# Patient Record
Sex: Female | Born: 1980 | Race: White | Hispanic: No | Marital: Married | Smoking: Never smoker
Health system: Southern US, Community
[De-identification: ages and names within clinical notes are randomized; demographics above are authoritative.]

---

## 2009-08-18 ENCOUNTER — Ambulatory Visit: Payer: Self-pay | Admitting: Family Medicine

## 2009-08-19 ENCOUNTER — Encounter: Payer: Self-pay | Admitting: Family Medicine

## 2009-08-19 LAB — CONVERTED CEMR LAB
Eosinophils Relative: 4 % (ref 0–5)
HCT: 40.2 % (ref 36.0–46.0)
Lymphocytes Relative: 35 % (ref 12–46)
Lymphs Abs: 2 10*3/uL (ref 0.7–4.0)
Neutro Abs: 3.3 10*3/uL (ref 1.7–7.7)
Neutrophils Relative %: 56 % (ref 43–77)
Platelets: 252 10*3/uL (ref 150–400)
Sed Rate: 2 mm/hr (ref 0–22)
WBC: 5.8 10*3/uL (ref 4.0–10.5)

## 2010-07-26 ENCOUNTER — Ambulatory Visit: Payer: Self-pay | Admitting: Emergency Medicine

## 2010-07-26 LAB — CONVERTED CEMR LAB
Beta hcg, urine, semiquantitative: NEGATIVE
Nitrite: NEGATIVE
Protein, U semiquant: 30
Urobilinogen, UA: 0.2
pH: 5

## 2010-08-20 ENCOUNTER — Ambulatory Visit: Payer: Self-pay | Admitting: Family Medicine

## 2010-09-11 ENCOUNTER — Ambulatory Visit: Payer: Self-pay | Admitting: Family Medicine

## 2010-09-12 ENCOUNTER — Encounter: Payer: Self-pay | Admitting: Family Medicine

## 2010-09-14 ENCOUNTER — Telehealth (INDEPENDENT_AMBULATORY_CARE_PROVIDER_SITE_OTHER): Payer: Self-pay

## 2011-01-15 NOTE — Letter (Signed)
Summary: CONTROLLED MED PRESCRIPTION POLICY  CONTROLLED MED PRESCRIPTION POLICY   Imported By: Dannette Barbara 09/11/2010 11:36:31  _____________________________________________________________________  External Attachment:    Type:   Image     Comment:   External Document

## 2011-01-15 NOTE — Assessment & Plan Note (Signed)
Summary: Frequent, painful urination x 3 dys rm 4   Vital Signs:  Patient Profile:   30 Years Old Female CC:      Frequent, painful urination x 3 dys LMP:     06/17/2010 Height:     65 inches Weight:      133 pounds O2 Sat:      100 % O2 treatment:    Room Air Temp:     97.4 degrees F oral Pulse rate:   97 / minute Pulse rhythm:   regular Resp:     16 per minute BP sitting:   106 / 73  (left arm) Cuff size:   regular  Vitals Entered By: Areta Haber CMA (July 26, 2010 7:32 PM)  Menstrual History: LMP (date): 06/17/2010 LMP - Reliable: approximate (month known)                  Current Allergies: No known allergies History of Present Illness History from: patient Chief Complaint: Frequent, painful urination x 3 dys History of Present Illness: Dysuria, frequency, mild hematuria.  No F/C/N/V.  No back pain.  Very mild lower abd pain.  She has had a UTI before and it feels like this.  No OTC meds tried.  No vaginal discharge.  REVIEW OF SYSTEMS Constitutional Symptoms      Denies fever, chills, night sweats, weight loss, weight gain, and fatigue.  Eyes       Denies change in vision, eye pain, eye discharge, glasses, contact lenses, and eye surgery. Ear/Nose/Throat/Mouth       Denies hearing loss/aids, change in hearing, ear pain, ear discharge, dizziness, frequent runny nose, frequent nose bleeds, sinus problems, sore throat, hoarseness, and tooth pain or bleeding.  Respiratory       Denies dry cough, productive cough, wheezing, shortness of breath, asthma, bronchitis, and emphysema/COPD.  Cardiovascular       Denies murmurs, chest pain, and tires easily with exhertion.    Gastrointestinal       Denies stomach pain, nausea/vomiting, diarrhea, constipation, blood in bowel movements, and indigestion. Genitourniary       Complains of painful urination.      Denies kidney stones and loss of urinary control.      Comments: Frequent x 4 dys Neurological       Denies  paralysis, seizures, and fainting/blackouts. Musculoskeletal       Denies muscle pain, joint pain, joint stiffness, decreased range of motion, redness, swelling, muscle weakness, and gout.  Skin       Denies bruising, unusual mles/lumps or sores, and hair/skin or nail changes.  Psych       Denies mood changes, temper/anger issues, anxiety/stress, speech problems, depression, and sleep problems. Other Comments: Pt has not seen her PCP for this.  Physical Exam General appearance: well developed, well nourished, no acute distress Chest/Lungs: no rales, wheezes, or rhonchi bilateral, breath sounds equal without effort Heart: regular rate and  rhythm, no murmur Abdomen: soft, non-tender without obvious organomegaly Back: no CVAT Skin: no obvious rashes or lesions Assessment New Problems: URINARY TRACT INFECTION (ICD-599.0)   Patient Education: Patient and/or caregiver instructed in the following: rest, fluids, Ibuprofen prn.  Plan New Medications/Changes: BACTRIM DS 800-160 MG TABS (SULFAMETHOXAZOLE-TRIMETHOPRIM) 1 tab by mouth two times a day for 5 days  #10 x 0, 07/26/2010, Hoyt Koch MD PYRIDIUM 200 MG TABS (PHENAZOPYRIDINE HCL) 1 tab by mouth three times a day for 2 days for urinary pain  #6 x 0, 07/26/2010, Tinnie Gens  Henderson MD  New Orders: UA Dipstick w/o Micro (automated)  [81003] Urine Pregnancy Test  [81025] Est. Patient Level III [60454] Planning Comments:   Hydration Wipe front to back Follow-up with your primary care physician if not improving   The patient and/or caregiver has been counseled thoroughly with regard to medications prescribed including dosage, schedule, interactions, rationale for use, and possible side effects and they verbalize understanding.  Diagnoses and expected course of recovery discussed and will return if not improved as expected or if the condition worsens. Patient and/or caregiver verbalized understanding.  Prescriptions: BACTRIM DS  800-160 MG TABS (SULFAMETHOXAZOLE-TRIMETHOPRIM) 1 tab by mouth two times a day for 5 days  #10 x 0   Entered and Authorized by:   Hoyt Koch MD   Signed by:   Hoyt Koch MD on 07/26/2010   Method used:   Print then Give to Patient   RxID:   [id number] PYRIDIUM 200 MG TABS (PHENAZOPYRIDINE HCL) 1 tab by mouth three times a day for 2 days for urinary pain  #6 x 0   Entered and Authorized by:   Hoyt Koch MD   Signed by:   Hoyt Koch MD on 07/26/2010   Method used:   Print then Give to Patient   RxID:   [id number]   Orders Added: 1)  UA Dipstick w/o Micro (automated)  [81003] 2)  Urine Pregnancy Test  [81025] 3)  Est. Patient Level III [41324]  Laboratory Results   Urine Tests  Date/Time Received: July 26, 2010 7:40 PM  Date/Time Reported: July 26, 2010 7:40 PM   Routine Urinalysis   Color: brown Appearance: Hazy Glucose: negative   (Normal Range: Negative) Bilirubin: negative   (Normal Range: Negative) Ketone: negative   (Normal Range: Negative) Spec. Gravity: >=1.030   (Normal Range: 1.003-1.035) Blood: moderate   (Normal Range: Negative) pH: 5.0   (Normal Range: 5.0-8.0) Protein: 30   (Normal Range: Negative) Urobilinogen: 0.2   (Normal Range: 0-1) Nitrite: negative   (Normal Range: Negative) Leukocyte Esterace: small   (Normal Range: Negative)    Urine HCG: negative

## 2011-01-15 NOTE — Letter (Signed)
Summary: Out of Work  [facility]  [address]   [place], [place] [address]   Phone: [phone]  Fax: [phone]    September 11, 2010   Employee:  Victoria Pace    To Whom It May Concern:   For Medical reasons, please excuse the above named employee from work today.  If you need additional information, please feel free to contact our office.         Sincerely,    Donna Christen MD

## 2011-01-15 NOTE — Assessment & Plan Note (Signed)
Summary: Bee Sting - R hand x 2dys rm 5   Vital Signs:  Patient Profile:   30 Years Old Female CC:      Bee Sting - R hand x 2 dys Height:     65 inches Weight:      133 pounds O2 Sat:      100 % O2 treatment:    Room Air Temp:     97.5 degrees F oral Pulse rate:   61 / minute Pulse rhythm:   regular Resp:     16 per minute BP sitting:   77 / 59  (left arm) Cuff size:   regular  Vitals Entered By: Areta Haber CMA (August 20, 2010 8:28 AM)                  Current Allergies: No known allergies History of Present Illness Chief Complaint: Bee Sting - R hand x 2 dys History of Present Illness:  Subjective:  Patient complains of bee sting to the dorsum of her right hand two days ago.  Despite taking Benadryl, the redness and swelling have expanded, and the area has become more tender.  She feels well otherwise.  No fevers, chills, and sweats.  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  Eyes       Denies change in vision, eye pain, eye discharge, glasses, contact lenses, and eye surgery. Ear/Nose/Throat/Mouth       Denies hearing loss/aids, change in hearing, ear pain, ear discharge, dizziness, frequent runny nose, frequent nose bleeds, sinus problems, sore throat, hoarseness, and tooth pain or bleeding.  Respiratory       Denies dry cough, productive cough, wheezing, shortness of breath, asthma, bronchitis, and emphysema/COPD.  Cardiovascular       Denies murmurs, chest pain, and tires easily with exhertion.    Gastrointestinal       Denies stomach pain, nausea/vomiting, diarrhea, constipation, blood in  bowel movements, and indigestion. Genitourniary       Denies painful urination, kidney stones, and loss of urinary control. Neurological       Denies paralysis, seizures, and fainting/blackouts. Musculoskeletal       Complains of redness and swelling.      Denies muscle pain, joint pain, joint stiffness, decreased range of motion, muscle weakness, and gout.      Comments: R hand x 2 dys Skin       Denies bruising, unusual mles/lumps or sores, and hair/skin or nail changes.  Psych       Denies mood changes, temper/anger issues, anxiety/stress, speech problems, depression, and sleep problems. Other Comments: Pt states she got stung by a bee on Saturday 08/19/10 on her R hand (index finger).   Past History:  Past Medical History: Last updated: 08/18/2009 Unremarkable  Past Surgical History: Last updated: 08/18/2009 Denies surgical history  Family History: Last updated: 08/18/2009 unremarkable   Objective:  Appearance:  Patient appears healthy, stated age, and in no acute distress  Right hand:  Mild swelling, erythema, warmth, and tenderness over the second MCP joint.  All fingers have full range of motion.  Distal neurovascular intact  Assessment New Problems: TOXIC EFFECT OF  VENOM (ICD-989.5) CELLULITIS, HAND, RIGHT (ICD-682.4)  CELLULITIS AS A RESULT OF INSECT STING  Plan New Medications/Changes: CEPHALEXIN 500 MG TABS (CEPHALEXIN) One by mouth Q6HR  #28 x 0, 08/20/2010, Donna Christen MD  New Orders: Est. Patient Level III [phone] Planning Comments:   Begin Keflex for one week. Call if not improved 48 hours.  Return for worsening symptoms   The patient and/or caregiver has been counseled thoroughly with regard to medications prescribed including dosage, schedule, interactions, rationale for use, and possible side effects and they verbalize understanding.  Diagnoses and expected course of recovery discussed and will return if not improved as expected or if the condition  worsens. Patient and/or caregiver verbalized understanding.  Prescriptions: CEPHALEXIN 500 MG TABS (CEPHALEXIN) One by mouth Q6HR  #28 x 0   Entered and Authorized by:   Donna Christen MD   Signed by:   Donna Christen MD on 08/20/2010   Method used:   Print then Give to Patient   RxID:   [id number]   Orders Added: 1)  Est. Patient Level III [13244]

## 2011-01-15 NOTE — Assessment & Plan Note (Signed)
Summary: FLU SYMPTOMS   Vital Signs:  Patient Profile:   30 Years Old Female CC:      abdominal pain, nausea, diarrhea x 4 days Height:     65 inches Weight:      130 pounds O2 Sat:      100 % O2 treatment:    Room Air Temp:     97.6 degrees F oral Pulse rate:   101 / minute Resp:     14 per minute BP sitting:   83 / 64  (left arm) Cuff size:   regular  Pt. in pain?   yes    Location:   abdomen  Vitals Entered By: Lajean Saver RN (September 11, 2010 11:37 AM)                   Updated Prior Medication List: IMODIUM A-D 2 MG TABS (LOPERAMIDE HCL) as needed since diarrhea  Current Allergies: No known allergies History of Present Illness Chief Complaint: abdominal pain, nausea, diarrhea x 4 days History of Present Illness:  Subjective:  Patient complains of 4 day history of abdominal cramps, headache, fatigue, nausea without vomiting, myalgias, and watery diarrhea.  She has had chills but no fever.   Her diarrhea improves for about 12 hours with Imodium but recurs.  No recent foreign travel.  No untreated water.  No abdominal pain.  No urinary symptoms.  No blood instool  REVIEW OF SYSTEMS Constitutional Symptoms       Complains of chills and night sweats.     Denies fever, weight loss, weight gain, and fatigue.  Eyes       Denies change in vision, eye pain, eye discharge, glasses, contact lenses, and eye surgery. Ear/Nose/Throat/Mouth       Denies hearing loss/aids, change in hearing, ear pain, ear discharge, dizziness, frequent runny nose, frequent nose bleeds, sinus problems, sore throat, hoarseness, and tooth pain or bleeding.  Respiratory       Denies dry cough, productive cough, wheezing, shortness of breath, asthma, bronchitis, and emphysema/COPD.  Cardiovascular       Denies murmurs, chest pain, and tires easily with exhertion.    Gastrointestinal       Complains of stomach pain, nausea/vomiting, and diarrhea.      Denies constipation, blood in bowel  movements, and indigestion.      Comments: No vomiting Genitourniary       Denies painful urination, kidney stones, and loss of urinary control. Neurological       Denies paralysis, seizures, and fainting/blackouts. Musculoskeletal       Complains of muscle pain.      Denies joint pain, joint stiffness, decreased range of motion, redness, swelling, muscle weakness, and gout.      Comments: back and body aches Skin       Denies bruising, unusual mles/lumps or sores, and hair/skin or nail changes.  Psych       Denies mood changes, temper/anger issues, anxiety/stress, speech problems, depression, and sleep problems. Other Comments: Patient c/o diarrhea begining about 4 days ago foloowed by abdominal pain, body aches, and lower backpain. Denies any vomiting or fever. Unabl to eat or drink significant amount.   Past History:  Past Medical History: Reviewed history from 08/18/2009 and no changes required. Unremarkable  Past Surgical History: Reviewed history from 08/18/2009 and no changes required. Denies surgical history  Family History:  Family History of Prostate CA 1st degree relative <50-father  Social History: Never Smoked Alcohol use-no Drug use-no  Smoking Status:  never Drug Use:  no   Objective:  Appearance:  Patient appears healthy, stated age, and in no acute distress  Eyes:  Pupils are equal, round, and reactive to light and accomdation.  Extraocular movement is intact.  Conjunctivae are not inflamed.  Ears:  Canals normal.  Tympanic membranes normal.   Nose:  No sinus tenderness Pharynx:  Normal, moist mucous membranes  Neck:  Supple.  No adenopathy is present.  No thyromegaly is present  Lungs:  Clear to auscultation.  Breath sounds are equal.  Heart:  Regular rate and rhythm without murmurs, rubs, or gallops.  Abdomen:  Nontender without masses or hepatosplenomegaly.  Bowel sounds are present.  No CVA or flank tenderness.  Skin:  No rash CBC:  WBC 3.4; Hgb  15.9 Assessment New Problems: DIARRHEA, ACUTE (ICD-787.91)   Plan New Orders: CBC w/Diff [16109-60454] Est. Patient Level III [09811] Planning Comments:   Begin Pedialyte today, then gradually advance diet tonmorrow.  May continue Imodium.  Avoid milk products. Follow-up with PCP if not improving 3 to 4 days or if increased pain, fever develop   The patient and/or caregiver has been counseled thoroughly with regard to medications prescribed including dosage, schedule, interactions, rationale for use, and possible side effects and they verbalize understanding.  Diagnoses and expected course of recovery discussed and will return if not improved as expected or if the condition worsens. Patient and/or caregiver verbalized understanding.   Orders Added: 1)  CBC w/Diff [91478-29562] 2)  Est. Patient Level III [13086]

## 2011-01-15 NOTE — Letter (Signed)
Summary: Handout Printed  Printed Handout:  - Clear Liquid Diet-Brief 

## 2011-01-15 NOTE — Progress Notes (Signed)
  Phone Note Outgoing Call   Call placed by: Areta Haber CMA,  September 14, 2010 5:46 PM Call placed to: Patient Summary of Call: Courtesy call made to pt - per recording Home number is disconnected. Initial call taken by: Areta Haber CMA,  September 14, 2010 5:47 PM

## 2011-03-04 ENCOUNTER — Encounter: Payer: Self-pay | Admitting: Family Medicine

## 2011-03-04 ENCOUNTER — Inpatient Hospital Stay (INDEPENDENT_AMBULATORY_CARE_PROVIDER_SITE_OTHER)
Admission: RE | Admit: 2011-03-04 | Discharge: 2011-03-04 | Disposition: A | Discharge status: Home / Self Care | Payer: BC Managed Care – PPO | Source: Ambulatory Visit | Attending: Family Medicine | Admitting: Family Medicine

## 2011-03-04 ENCOUNTER — Inpatient Hospital Stay: Admission: RE | Admit: 2011-03-04 | Payer: BC Managed Care – PPO | Source: Ambulatory Visit

## 2011-03-04 DIAGNOSIS — J069 Acute upper respiratory infection, unspecified: Secondary | ICD-10-CM

## 2011-03-04 DIAGNOSIS — D649 Anemia, unspecified: Secondary | ICD-10-CM | POA: Insufficient documentation

## 2011-03-14 NOTE — Assessment & Plan Note (Signed)
Summary: HEAD COLD/WSE   Vital Signs:  Patient Profile:   30 Years Old Female CC:      Cold & URI symptoms Height:     65 inches Weight:      150.50 pounds O2 Sat:      97 % O2 treatment:    Room Air Temp:     100.0 degrees F oral Pulse rate:   106 / minute Resp:     16 per minute BP sitting:   96 / 65  (left arm) Cuff size:   regular  Vitals Entered By: Clemens Catholic LPN (March 04, 2011 5:05 PM)                  Updated Prior Medication List: PRENATAL/FOLIC ACID  TABS (PRENATAL VIT-FE FUMARATE-FA)   Current Allergies (reviewed today): No known allergies History of Present Illness Chief Complaint: Cold & URI symptoms History of Present Illness:  Subjective: Patient complains of URI symptoms that started 2 days ago with sinus congestion and mild sore throat, followed by a cough yesterday.  She is now [redacted] weeks pregnant.  She has had no abdominal pain or vaginal bleeding.   No pleuritic pain but has soreness over her sternum No wheezing + post-nasal drainage ? sinus pain/pressure No itchy/red eyes ? right earache No hemoptysis No SOB + fever/chills No nausea No vomiting No abdominal pain No diarrhea No skin rashes + fatigue No myalgias No headache    REVIEW OF SYSTEMS Constitutional Symptoms       Complains of fever, chills, and night sweats.     Denies weight loss, weight gain, and fatigue.  Eyes       Denies change in vision, eye pain, eye discharge, glasses, contact lenses, and eye surgery. Ear/Nose/Throat/Mouth       Complains of frequent runny nose, sinus problems, sore throat, and hoarseness.      Denies hearing loss/aids, change in hearing, ear pain, ear discharge, dizziness, frequent nose bleeds, and tooth pain or bleeding.  Respiratory       Complains of dry cough and wheezing.      Denies productive cough, shortness of breath, asthma, bronchitis, and emphysema/COPD.  Cardiovascular       Complains of chest pain.      Denies murmurs and tires  easily with exhertion.    Gastrointestinal       Denies stomach pain, nausea/vomiting, diarrhea, constipation, blood in bowel movements, and indigestion. Genitourniary       Denies painful urination, kidney stones, and loss of urinary control. Neurological       Complains of headaches.      Denies paralysis, seizures, and fainting/blackouts. Musculoskeletal       Denies muscle pain, joint pain, joint stiffness, decreased range of motion, redness, swelling, muscle weakness, and gout.  Skin       Denies bruising, unusual mles/lumps or sores, and hair/skin or nail changes.  Psych       Denies mood changes, temper/anger issues, anxiety/stress, speech problems, depression, and sleep problems. Other Comments: pt c/o sore throat, cough, chest hurts to cough, HA, bilateral ear ache, and fever 1 1/2 days.no OTC meds. she is [redacted]wks pregnant.   Past History:  Past Medical History: Reviewed history from 08/18/2009 and no changes required. Unremarkable  Past Surgical History: Reviewed history from 08/18/2009 and no changes required. Denies surgical history  Family History: Reviewed history from 09/11/2010 and no changes required.  Family History of Prostate CA 1st degree relative <50-father  Social History: Reviewed history from 09/11/2010 and no changes required. Never Smoked Alcohol use-no Drug use-no   Objective:  Appearance:  Patient appears healthy, stated age, and in no acute distress  Eyes:  Pupils are equal, round, and reactive to light and accomdation.  Extraocular movement is intact.  Conjunctivae are not inflamed.  Ears:  Canals normal.  Tympanic membranes normal.   Nose:  Mildly congested turbinates; no sinus tenderness Pharynx:  Normal  Neck:  Supple.  Slightly tender shotty anterior/posterior nodes are palpated bilaterally, primarily on the right. Lungs:  Clear to auscultation.  Breath sounds are equal.  Heart:  Regular rate and rhythm without murmurs, rubs, or gallops.   Abdomen:  Gravid, nontender without masses or hepatosplenomegaly.  Bowel sounds are present.  No CVA or flank tenderness.  Extremities:  No edema.   Skin:  No rash CBC:  WBC:  7.7; 8.4 LY, 7.5 MO, 84.1 GR; Hgb 10.1 Assessment New Problems: ANEMIA (ICD-285.9) INTRAUTERINE PREGNANCY (ICD-V22.2) UPPER RESPIRATORY INFECTION, ACUTE (ICD-465.9)  VIRAL URI; NO EVIDENCE BACTERIAL INFECTION TODAY  Plan New Medications/Changes: AMOXICILLIN 500 MG CAPS (AMOXICILLIN) One capsule by mouth three times a day (Rx void after 03/12/11)  #21 x 0, 03/04/2011, Donna Christen MD  New Orders: Rapid Strep [16109] CBC w/Diff [60454-09811] Est. Patient Level III [91478] Pulse Oximetry (single measurment) [94760] Planning Comments:   Treat symptomatically for now:  Increase fluid intake, begin expectorant/decongestan.   If fever/chills/sweats persist, or if not improving 5 to 7 days begin amoxicillin (given Rx to hold).  Followup with PCP if not improving 10 to 14 days.   Follow-up with OB for anemia.   The patient and/or caregiver has been counseled thoroughly with regard to medications prescribed including dosage, schedule, interactions, rationale for use, and possible side effects and they verbalize understanding.  Diagnoses and expected course of recovery discussed and will return if not improved as expected or if the condition worsens. Patient and/or caregiver verbalized understanding.  Prescriptions: AMOXICILLIN 500 MG CAPS (AMOXICILLIN) One capsule by mouth three times a day (Rx void after 03/12/11)  #21 x 0   Entered and Authorized by:   Donna Christen MD   Signed by:   Donna Christen MD on 03/04/2011   Method used:   Print then Give to Patient   RxID:   [id number]   Orders Added: 1)  Rapid Strep [62952] 2)  CBC w/Diff [[id number]] 3)  Est. Patient Level III [27253] 4)  Pulse Oximetry (single measurment) [94760]    Laboratory Results  Date/Time Received: March 04, 2011 5:08 PM  Date/Time  Reported: March 04, 2011 5:08 PM   Other Tests  Rapid Strep: negative  Kit Test Internal QC: Negative   (Normal Range: Negative)

## 2013-02-26 ENCOUNTER — Encounter: Payer: Self-pay | Admitting: *Deleted

## 2013-02-26 ENCOUNTER — Emergency Department (INDEPENDENT_AMBULATORY_CARE_PROVIDER_SITE_OTHER)
Admission: EM | Admit: 2013-02-26 | Discharge: 2013-02-26 | Disposition: A | Discharge status: Home / Self Care | Payer: BC Managed Care – PPO | Source: Home / Self Care | Attending: Emergency Medicine | Admitting: Emergency Medicine

## 2013-02-26 DIAGNOSIS — R509 Fever, unspecified: Secondary | ICD-10-CM

## 2013-02-26 MED ORDER — PENICILLIN G BENZATHINE 1200000 UNIT/2ML IM SUSP
1.2000 10*6.[IU] | Freq: Once | INTRAMUSCULAR | Status: AC
  Administered 2013-02-26: 1.2 10*6.[IU] via INTRAMUSCULAR

## 2013-02-26 MED ORDER — AMOXICILLIN-POT CLAVULANATE 875-125 MG PO TABS
1.0000 | ORAL_TABLET | Freq: Two times a day (BID) | ORAL | Status: DC
Start: 2013-02-26 — End: 2015-02-18

## 2013-02-26 NOTE — ED Notes (Addendum)
Pt c/o sore throat, fever, body aches, and nausea x 1 day. She reports receiving a flu vaccine in oct 13'.

## 2013-02-26 NOTE — ED Provider Notes (Signed)
History     CSN: [id number]  Arrival date & time 02/26/13  3086   First MD Initiated Contact with Patient 02/26/13 1949      Chief Complaint  Patient presents with  . Sore Throat  . Fever  . Generalized Body Aches  . Nausea    (Consider location/radiation/quality/duration/timing/severity/associated sxs/prior treatment) HPI Victoria Pace is a 32 y.o. female who complains of onset of cold symptoms for 1 days.  The symptoms are constant and moderate in severity.  Rapid onset.  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  Not taking any medications.  Had flu shot about 6 months ago. + sore throat (main symptom) No cough No pleuritic pain No wheezing No nasal congestion No post-nasal drainage No sinus pain/pressure No chest congestion No itchy/red eyes No earache No hemoptysis No SOB + chills/sweats + fever + nausea (last night) + vomiting (x1 last night) + mild abdominal pain (last night) No diarrhea No skin rashes No fatigue + minimal myalgias + mild headache    History reviewed. No pertinent past medical history.  History reviewed. No pertinent past surgical history.  History reviewed. No pertinent family history.  History  Substance Use Topics  . Smoking status: Never Smoker   . Smokeless tobacco: Not on file  . Alcohol Use: No    OB History   Grav Para Term Preterm Abortions TAB SAB Ect Mult Living                  Review of Systems  Allergies  Review of patient's allergies indicates no known allergies.  Home Medications   Current Outpatient Rx  Name  Route  Sig  Dispense  Refill  . amoxicillin-clavulanate (AUGMENTIN) 875-125 MG per tablet   Oral   Take 1 tablet by mouth 2 (two) times daily.   14 tablet   0     BP 83/58  Pulse 114  Temp(Src) 101.6 F (38.7 C) (Oral)  SpO2 97%  Physical Exam  Nursing note and vitals reviewed. Constitutional: She is oriented to person, place, and time. She appears well-developed and  well-nourished.  Non-toxic appearance. She has a sickly appearance. She appears ill.  HENT:  Head: Normocephalic and atraumatic.  Right Ear: Tympanic membrane, external ear and ear canal normal.  Left Ear: External ear and ear canal normal. Tympanic membrane is erythematous.  Nose: Mucosal edema and rhinorrhea present.  Mouth/Throat: Uvula is midline. Posterior oropharyngeal erythema present. No oropharyngeal exudate or posterior oropharyngeal edema.  Eyes: No scleral icterus.  Neck: Neck supple.  Cardiovascular: Normal rate, regular rhythm and normal heart sounds.   Pulmonary/Chest: Effort normal and breath sounds normal. No respiratory distress. She has no decreased breath sounds. She has no wheezes. She has no rhonchi.  Neurological: She is alert and oriented to person, place, and time.  Skin: Skin is warm and dry.  Psychiatric: She has a normal mood and affect. Her speech is normal.    ED Course  Procedures (including critical care time)  Labs Reviewed - No data to display No results found.   1. Strep pharyngitis   2. Fever, unspecified       MDM  1)  Take the prescribed antibiotic as instructed.  Appears physically to be almost flu-like, but rapid strep faintly positive.  Also with pink L TM.  Tx with Bicillin IM in clinic per pt preference.  Will give Augmentin Rx as well and will start in 2 days if no significant improvement.  Decided against Tamiflu since not with normal flu-like symptoms and with flu shot this year. Needs to be taking ATC Ibu and/or APAP to keep fever down and also encourage hydration.  Her BP is normally low, but still needs to hydrate.  Follow up with your primary doctor if no improvement in 5-7 days, sooner if increasing pain, fever, or new symptoms.   ER precautions for worsening symptoms.  Marlaine Hind, MD 02/26/13 2000

## 2013-02-27 ENCOUNTER — Telehealth: Payer: Self-pay | Admitting: Emergency Medicine

## 2015-02-18 ENCOUNTER — Encounter: Payer: Self-pay | Admitting: Emergency Medicine

## 2015-02-18 ENCOUNTER — Emergency Department (INDEPENDENT_AMBULATORY_CARE_PROVIDER_SITE_OTHER)
Admission: EM | Admit: 2015-02-18 | Discharge: 2015-02-18 | Disposition: A | Discharge status: Home / Self Care | Payer: PRIVATE HEALTH INSURANCE | Source: Home / Self Care | Attending: Family Medicine | Admitting: Family Medicine

## 2015-02-18 DIAGNOSIS — J111 Influenza due to unidentified influenza virus with other respiratory manifestations: Secondary | ICD-10-CM

## 2015-02-18 DIAGNOSIS — R69 Illness, unspecified: Principal | ICD-10-CM

## 2015-02-18 DIAGNOSIS — R5383 Other fatigue: Secondary | ICD-10-CM

## 2015-02-18 LAB — POCT CBC W AUTO DIFF (K'VILLE URGENT CARE)

## 2015-02-18 LAB — POCT RAPID STREP A (OFFICE): Rapid Strep A Screen: NEGATIVE

## 2015-02-18 MED ORDER — TRAMADOL HCL 50 MG PO TABS: 50.0000 mg | ORAL_TABLET | Freq: Four times a day (QID) | ORAL | Status: DC | PRN

## 2015-02-18 MED ORDER — OSELTAMIVIR PHOSPHATE 75 MG PO CAPS: 75.0000 mg | ORAL_CAPSULE | Freq: Two times a day (BID) | ORAL | Status: DC

## 2015-02-18 MED ORDER — SODIUM CHLORIDE 0.9 % IV BOLUS (SEPSIS)
1000.0000 mL | Freq: Once | INTRAVENOUS | Status: AC
  Administered 2015-02-18: 1000 mL via INTRAVENOUS

## 2015-02-18 NOTE — ED Notes (Signed)
Patient reports sudden onset fever, aches, headache and sore throat last evening; feels totally miserable; no OTC since last night; fever today 105 prior to coming to [facility].

## 2015-02-18 NOTE — Discharge Instructions (Signed)
Thank you for coming in today. Call or go to the emergency room if you get worse, have trouble breathing, have chest pains, or palpitations.  Take up to 2 aleve twice daily or 2 extre strength tylenol 4x daily or 4 ibuprofen (800mg ) every 8 hours for pain or fever.   Influenza Influenza ("the flu") is a viral infection of the respiratory tract. It occurs more often in winter months because people spend more time in close contact with one another. Influenza can make you feel very sick. Influenza easily spreads from person to person (contagious). CAUSES  Influenza is caused by a virus that infects the respiratory tract. You can catch the virus by breathing in droplets from an infected person's cough or sneeze. You can also catch the virus by touching something that was recently contaminated with the virus and then touching your mouth, nose, or eyes. RISKS AND COMPLICATIONS You may be at risk for a more severe case of influenza if you smoke cigarettes, have diabetes, have chronic heart disease (such as heart failure) or lung disease (such as asthma), or if you have a weakened immune system. Elderly people and pregnant women are also at risk for more serious infections. The most common problem of influenza is a lung infection (pneumonia). Sometimes, this problem can require emergency medical care and may be life threatening. SIGNS AND SYMPTOMS  Symptoms typically last 4 to 10 days and may include:  Fever.  Chills.  Headache, body aches, and muscle aches.  Sore throat.  Chest discomfort and cough.  Poor appetite.  Weakness or feeling tired.  Dizziness.  Nausea or vomiting. DIAGNOSIS  Diagnosis of influenza is often made based on your history and a physical exam. A nose or throat swab test can be done to confirm the diagnosis. TREATMENT  In mild cases, influenza goes away on its own. Treatment is directed at relieving symptoms. For more severe cases, your health care provider may prescribe  antiviral medicines to shorten the sickness. Antibiotic medicines are not effective because the infection is caused by a virus, not by bacteria. HOME CARE INSTRUCTIONS  Take medicines only as directed by your health care provider.  Use a cool mist humidifier to make breathing easier.  Get plenty of rest until your temperature returns to normal. This usually takes 3 to 4 days.  Drink enough fluid to keep your urine clear or pale yellow.  Cover yourmouth and nosewhen coughing or sneezing,and wash your handswellto prevent thevirusfrom spreading.  Stay homefromwork orschool untilthe fever is gonefor at least 46full day. PREVENTION  An annual influenza vaccination (flu shot) is the best way to avoid getting influenza. An annual flu shot is now routinely recommended for all adults in the [place] IF:  You experiencechest pain, yourcough worsens,or you producemore mucus.  Youhave nausea,vomiting, ordiarrhea.  Your fever returns or gets worse. SEEK IMMEDIATE MEDICAL CARE IF:  You havetrouble breathing, you become short of breath,or your skin ornails becomebluish.  You have severe painor stiffnessin the neck.  You develop a sudden headache, or pain in the face or ear.  You have nausea or vomiting that you cannot control. MAKE SURE YOU:   Understand these instructions.  Will watch your condition.  Will get help right away if you are not doing well or get worse. Document Released: 11/29/2000 Document Revised: 04/18/2014 Document Reviewed: 03/02/2012 [facility] Patient Information 2015 [place], [place]. This information is not intended to replace advice given to you by your health care provider. Make  sure you discuss any questions you have with your health care provider.

## 2015-02-18 NOTE — ED Provider Notes (Signed)
Victoria Pace is a 34 y.o. female who presents to Urgent Care today for fevers chills bodyaches headaches sore throat. Symptoms started yesterday. Patient also feels somewhat dizzy and lightheaded as well as fatigue. No vomiting or diarrhea. Patient is not pregnant nor she breast-feeding. She's tried Tylenol which did not help much.   History reviewed. No pertinent past medical history. Past Surgical History  Procedure Laterality Date  . Cesarean section     History  Substance Use Topics  . Smoking status: Never Smoker   . Smokeless tobacco: Not on file  . Alcohol Use: No   ROS as above Medications: No current facility-administered medications for this encounter.   Current Outpatient Prescriptions  Medication Sig Dispense Refill  . oseltamivir (TAMIFLU) 75 MG capsule Take 1 capsule (75 mg total) by mouth every 12 (twelve) hours. 10 capsule 0  . traMADol (ULTRAM) 50 MG tablet Take 1 tablet (50 mg total) by mouth every 6 (six) hours as needed for severe pain (or cough). 15 tablet 0   No Known Allergies   Exam:  BP 98/61 mmHg  Pulse 110  Temp(Src) 102.2 F (39 C) (Oral)  Resp 18  Ht 5\' 8"  (1.727 m)  Wt 140 lb (63.504 kg)  BMI 21.29 kg/m2  SpO2 98%  LMP 02/04/2015 (Approximate)   Gen: Well NAD fatigued appearing. HEENT: EOMI,  MMM posterior pharynx is erythematous Lungs: Normal work of breathing. CTABL Heart: Tachycardia no MRG Abd: NABS, Soft. Nondistended, Nontender Exts: Brisk capillary refill, warm and well perfused.   CBC shows WBC 9.4, hemoglobin 12.2, platelets 203  Patient was given 975 mg of Tylenol orally, and 1 L normal saline bolus, and felt much better. Temperature decreased to 99.54F, and heart rate decreased to 76 bpm  Results for orders placed or performed during the hospital encounter of 02/18/15 (from the past 24 hour(s))  POCT CBC w auto diff ([place])     Status: None   Collection Time: 02/18/15  2:49 PM  Result Value Ref Range   WBC   4.5 - 10.5 K/uL   Lymphocytes relative %  15 - 45 %   Monocytes relative %  2 - 10 %   Neutrophils relative % (GR)  44 - 76 %   Lymphocytes absolute  0.1 - 1.8 K/uL   Monocyes absolute  0.1 - 1 K/uL   Neutrophils absolute (GR#)  1.7 - 7.8 K/uL   RBC  3.8 - 5.1 MIL/uL   Hemoglobin  11.8 - 15.5 g/dL   Hematocrit  34.8 - 46 %   MCV  78 - 100 fL   MCH  26 - 32 pg   MCHC  32 - 36.5 g/dL   RDW  11.6 - 14 %   Platelet count  140 - 400 K/uL   MPV  7.8 - 11 fL  POCT rapid strep A     Status: None   Collection Time: 02/18/15  2:50 PM  Result Value Ref Range   Rapid Strep A Screen Negative Negative   No results found.  Assessment and Plan: 34 y.o. female with influenza. Treat with Tamiflu tramadol and NSAIDs. Return as needed.  Discussed warning signs or symptoms. Please see discharge instructions. Patient expresses understanding.     Gregor Hams, MD 02/18/15 [phone]

## 2015-02-19 ENCOUNTER — Telehealth: Payer: Self-pay | Admitting: Emergency Medicine

## 2015-02-20 ENCOUNTER — Encounter: Payer: Self-pay | Admitting: Emergency Medicine

## 2015-02-20 ENCOUNTER — Emergency Department (INDEPENDENT_AMBULATORY_CARE_PROVIDER_SITE_OTHER)
Admission: EM | Admit: 2015-02-20 | Discharge: 2015-02-20 | Disposition: A | Discharge status: Home / Self Care | Payer: PRIVATE HEALTH INSURANCE | Source: Home / Self Care | Attending: Family Medicine | Admitting: Family Medicine

## 2015-02-20 DIAGNOSIS — J029 Acute pharyngitis, unspecified: Secondary | ICD-10-CM | POA: Diagnosis not present

## 2015-02-20 LAB — POCT RAPID STREP A (OFFICE): RAPID STREP A SCREEN: NEGATIVE

## 2015-02-20 LAB — STREP A DNA PROBE: GASP: NEGATIVE

## 2015-02-20 MED ORDER — LIDOCAINE VISCOUS 2 % MT SOLN: 15.0000 mL | Freq: Three times a day (TID) | OROMUCOSAL | Status: DC

## 2015-02-20 MED ORDER — PREDNISONE 20 MG PO TABS: 20.0000 mg | ORAL_TABLET | Freq: Two times a day (BID) | ORAL | Status: DC

## 2015-02-20 NOTE — ED Notes (Signed)
Patient treated for Flu in [facility] on 3/516 with IVFs, tylenol and is taking Tamiflu. Feeling somewhat better but had temp 101 this morning and has very sore throat (Strep DNA negative from 3/5) and glands in neck sore to touch.

## 2015-02-20 NOTE — ED Provider Notes (Signed)
CSN: [id number]     Arrival date & time 02/20/15  1329 History   First MD Initiated Contact with Patient 02/20/15 1459     Chief Complaint  Patient presents with  . Sore Throat      HPI Comments: Patient was treated for influenza two days ago and continues Tamiflu.  She feels somewhat better but this morning had temp to 101 and very sore throat.  Rapid strep test and strep DNA probe were negative.  The history is provided by the patient.    History reviewed. No pertinent past medical history. Past Surgical History  Procedure Laterality Date  . Cesarean section     History reviewed. No pertinent family history. History  Substance Use Topics  . Smoking status: Never Smoker   . Smokeless tobacco: Not on file  . Alcohol Use: No   OB History    No data available     Review of Systems + sore throat + cough No pleuritic pain No wheezing No nasal congestion ? post-nasal drainage No sinus pain/pressure No itchy/red eyes ? earache No hemoptysis No SOB + fever, + chills No nausea No vomiting No abdominal pain No diarrhea No urinary symptoms No skin rash + fatigue + myalgias No headache Used OTC meds without relief  Allergies  Review of patient's allergies indicates no known allergies.  Home Medications   Prior to Admission medications   Medication Sig Start Date End Date Taking? Authorizing Provider  lidocaine (XYLOCAINE) 2 % solution Use as directed 15 mLs in the mouth or throat 4 (four) times daily -  before meals and at bedtime. Gargle, then expectorate 02/20/15   Kandra Nicolas, MD  oseltamivir (TAMIFLU) 75 MG capsule Take 1 capsule (75 mg total) by mouth every 12 (twelve) hours. 02/18/15   Gregor Hams, MD  predniSONE (DELTASONE) 20 MG tablet Take 1 tablet (20 mg total) by mouth 2 (two) times daily. Take with food. 02/20/15   Kandra Nicolas, MD  traMADol (ULTRAM) 50 MG tablet Take 1 tablet (50 mg total) by mouth every 6 (six) hours as needed for severe pain (or  cough). 02/18/15   Gregor Hams, MD   BP 91/62 mmHg  Pulse 79  Temp(Src) 98.1 F (36.7 C) (Oral)  SpO2 99%  LMP 02/04/2015 (Approximate) Physical Exam Nursing notes and Vital Signs reviewed. Appearance:  Patient appears stated age, and in no acute distress Eyes:  Pupils are equal, round, and reactive to light and accomodation.  Extraocular movement is intact.  Conjunctivae are not inflamed  Ears:  Canals normal.  Tympanic membranes normal.  Nose:  Mildly congested turbinates.  No sinus tenderness.   Pharynx:  Uvula and posterior pharynx erythematous but not edematous Neck:  Supple.  Large tender anterior nodes are palpated bilaterally, and tender shotty posterior nodes.  Lungs:  Clear to auscultation.  Breath sounds are equal.  Heart:  Regular rate and rhythm without murmurs, rubs, or gallops.  Abdomen:  Nontender without masses or hepatosplenomegaly.  Bowel sounds are present.  No CVA or flank tenderness.  Extremities:  No edema.  No calf tenderness Skin:  No rash present.   ED Course  Procedures  None    Labs Reviewed  POCT RAPID STREP A (OFFICE) negative         MDM   1. Pharyngitis    Finish Tamiflu.  Repeat throat culture pending. Begin prednisone burst, and lidocaine gargle QID. If cough becomes non-productive, take plain guaifenesin (600mg  or1200mg  extended  release tabs such as Mucinex) twice daily, with plenty of water.  May add Pseudoephedrine for sinus congestion.  Get adequate rest.   May use Afrin nasal spray (or generic oxymetazoline) twice daily for about 5 days.  Also recommend using saline nasal spray several times daily and saline nasal irrigation (AYR is a common brand).    Try warm salt water gargles for sore throat.  Stop all antihistamines for now, and other non-prescription cough/cold preparations.   Follow-up with family doctor if not improving.     Kandra Nicolas, MD 02/21/15 [phone]

## 2015-02-20 NOTE — Discharge Instructions (Signed)
If cough becomes non-productive, take plain guaifenesin (600mg  or1200mg  extended release tabs such as Mucinex) twice daily, with plenty of water.  May add Pseudoephedrine for sinus congestion.  Get adequate rest.   May use Afrin nasal spray (or generic oxymetazoline) twice daily for about 5 days.  Also recommend using saline nasal spray several times daily and saline nasal irrigation (AYR is a common brand).    Try warm salt water gargles for sore throat.  Stop all antihistamines for now, and other non-prescription cough/cold preparations.   Follow-up with family doctor if not improving.

## 2015-02-21 ENCOUNTER — Telehealth: Payer: Self-pay | Admitting: *Deleted

## 2015-02-21 LAB — STREP A DNA PROBE: GASP: POSITIVE

## 2015-02-28 ENCOUNTER — Ambulatory Visit (INDEPENDENT_AMBULATORY_CARE_PROVIDER_SITE_OTHER): Payer: PRIVATE HEALTH INSURANCE | Admitting: Family Medicine

## 2015-02-28 ENCOUNTER — Encounter: Payer: Self-pay | Admitting: Family Medicine

## 2015-02-28 VITALS — BP 98/60 | HR 70 | Ht 68.0 in | Wt 152.0 lb

## 2015-02-28 DIAGNOSIS — J020 Streptococcal pharyngitis: Secondary | ICD-10-CM

## 2015-02-28 NOTE — Progress Notes (Signed)
CC: GEMA RINGOLD is a 34 y.o. female is here for Establish Care   Subjective: HPI:  Pleasant 34 year old here to establish care  Reports that during the first week of March she was experiencing a mild to moderate sore throat on a daily basis that was not improving with ibuprofen. She was being treated for the flu however after 2-3 days of Tamiflu symptoms persisted. Streptococcal DNA was isolated on a culture and on the eighth she was started on penicillin. She's also been taking prednisone 20 mg daily because 40 mg daily was causing insomnia. She tells me now that she has no sore throat she feels like she is doing better has not had any fevers or chills since starting prednisone.  Review of Systems - General ROS: negative for - chills, fever, night sweats, weight gain or weight loss Ophthalmic ROS: negative for - decreased vision Psychological ROS: negative for - anxiety or depression ENT ROS: negative for - hearing change, nasal congestion, tinnitus or allergies Hematological and Lymphatic ROS: negative for - bleeding problems, bruising or swollen lymph nodes Breast ROS: negative Respiratory ROS: no cough, shortness of breath, or wheezing Cardiovascular ROS: no chest pain or dyspnea on exertion Gastrointestinal ROS: no abdominal pain, change in bowel habits, or black or bloody stools Genito-Urinary ROS: negative for - genital discharge, genital ulcers, incontinence or abnormal bleeding from genitals Musculoskeletal ROS: negative for - joint pain or muscle pain Neurological ROS: negative for - headaches or memory loss Dermatological ROS: negative for lumps, mole changes, rash and skin lesion changes  No past medical history on file.  Past Surgical History  Procedure Laterality Date  . Cesarean section  05/24/2011   Family History  Problem Relation Age of Onset  . Prostate cancer    . Diabetes Father     History   Social History  . Marital Status: Married    Spouse Name:  N/A  . Number of Children: N/A  . Years of Education: N/A   Occupational History  . Not on file.   Social History Main Topics  . Smoking status: Never Smoker   . Smokeless tobacco: Not on file  . Alcohol Use: No  . Drug Use: No  . Sexual Activity:    Partners: Male   Other Topics Concern  . Not on file   Social History Narrative     Objective: BP 98/60 mmHg  Pulse 70  Ht 5\' 8"  (1.727 m)  Wt 152 lb (68.947 kg)  BMI 23.12 kg/m2  LMP 02/04/2015 (Approximate)  Vital signs reviewed. General: Alert and Oriented, No Acute Distress HEENT: Pupils equal, round, reactive to light. Conjunctivae clear.  External ears unremarkable.  Moist mucous membranes. No pharyngeal lesions nor erythema. Uvula is midline Lungs: Clear and comfortable work of breathing, speaking in full sentences without accessory muscle use. Cardiac: Regular rate and rhythm.  Neuro: CN II-XII grossly intact, gait normal. Extremities: No peripheral edema.  Strong peripheral pulses.  Mental Status: No depression, anxiety, nor agitation. Logical though process. Skin: Warm and dry.  Assessment & Plan: Syriana was seen today for establish care.  Diagnoses and all orders for this visit:  Strep pharyngitis   Strep pharyngitis: Resolved I encouraged her to finish out a full 10 days of penicillin however she can stop prednisone now. Change toothbrush prior to ending penicillin.   Return if symptoms worsen or fail to improve.

## 2015-03-16 ENCOUNTER — Encounter: Payer: Self-pay | Admitting: Family Medicine

## 2015-03-16 DIAGNOSIS — D229 Melanocytic nevi, unspecified: Secondary | ICD-10-CM | POA: Insufficient documentation

## 2016-02-27 ENCOUNTER — Ambulatory Visit (INDEPENDENT_AMBULATORY_CARE_PROVIDER_SITE_OTHER): Payer: PRIVATE HEALTH INSURANCE | Admitting: Osteopathic Medicine

## 2016-02-27 ENCOUNTER — Encounter: Payer: Self-pay | Admitting: Osteopathic Medicine

## 2016-02-27 VITALS — BP 97/73 | HR 99 | Temp 99.9°F | Wt 167.0 lb

## 2016-02-27 DIAGNOSIS — J329 Chronic sinusitis, unspecified: Secondary | ICD-10-CM

## 2016-02-27 DIAGNOSIS — B349 Viral infection, unspecified: Secondary | ICD-10-CM

## 2016-02-27 DIAGNOSIS — B9789 Other viral agents as the cause of diseases classified elsewhere: Secondary | ICD-10-CM

## 2016-02-27 DIAGNOSIS — J069 Acute upper respiratory infection, unspecified: Secondary | ICD-10-CM

## 2016-02-27 MED ORDER — HYDROCODONE-HOMATROPINE 5-1.5 MG/5ML PO SYRP: 5.0000 mL | ORAL_SOLUTION | Freq: Three times a day (TID) | ORAL | Status: DC | PRN

## 2016-02-27 MED ORDER — IPRATROPIUM BROMIDE 0.03 % NA SOLN: 2.0000 | Freq: Three times a day (TID) | NASAL | Status: DC

## 2016-02-27 NOTE — Progress Notes (Signed)
HPI: Victoria Pace is a 35 y.o. female who presents to [facility]  today for chief complaint of:  Chief Complaint  Patient presents with  . Fever  . Cough   . Quality: head congestion . Assoc signs/symptoms: see ROS . Duration: 3-4 days . Modifying factors: has tried the following OTC/Rx medications: ibuprofen, sinus medicine - last dose yesterday  . Context:  Son recently had flu   Past medical, social and family history reviewed. Current medications and allergies reviewed.     Review of Systems: CONSTITUTIONAL: yes fever/chills, overall weaknes HEAD/EYES/EARS/NOSE/THROAT: yes headache, no vision change or hearing change, no sore throat CARDIAC: No chest pain/pressure/palpitations RESPIRATORY: no cough, no shortness of breath GASTROINTESTINAL: no nausea, no vomiting, no abdominal pain, no diarrhea MUSCULOSKELETAL: yes myalgia/arthralgia   Exam:  BP 97/73 mmHg  Pulse 99  Temp(Src) 99.9 F (37.7 C) (Oral)  Wt 167 lb (75.751 kg) Constitutional: VSS, see above. General Appearance: alert, well-developed, well-nourished, NAD Eyes: Normal lids and conjunctive, non-icteric sclera, PERRLA Ears, Nose, Mouth, Throat: Normal external inspection ears/nares/mouth/lips/gums, normal TM, MMM;       posterior pharynx with erythema, without exudate Neck: No masses, trachea midline. normal lymph nodes Respiratory: Normal respiratory effort. No  wheeze/rhonchi/rales Cardiovascular: S1/S2 normal, no murmur/rub/gallop auscultated. RRR.   ASSESSMENT/PLAN: OTC meds advised in addition to Rx, see pt instructions, call 10 days form onset of symptoms if no better, sooner if worse.   Viral URI with cough - Plan: HYDROcodone-homatropine (HYCODAN) 5-1.5 MG/5ML syrup, ipratropium (ATROVENT) 0.03 % nasal spray  Viral sinusitis    Return if symptoms worsen or fail to improve.

## 2016-02-27 NOTE — Patient Instructions (Signed)
Can take Nyquil/Dayquil which usually have guaifenisen cough medicine (Robitussin or Mucinex), plus acetaminophen (Tylenol) and a decongestant. You can take ibuprofen in addition to this. You've been given prescription for strong cough medicine and nasal spray to take with the over-the-counter medicines. Viral infections usually clear up in 7-10 days, if you're no better 10 days from when your symptoms started, or if you are feeling worse before then, please let us know.

## 2016-02-28 ENCOUNTER — Telehealth: Payer: Self-pay | Admitting: Osteopathic Medicine

## 2016-02-28 NOTE — Telephone Encounter (Signed)
Pt called clinic today and state the cough syrup at her visit yesterday helped her not cough at night but she cant take it during the day. Pt states it makes her very sleep and she has three young children. Pt also state the sinus pressure and congestion is worse. Will route to treating Provider for review.

## 2016-02-29 NOTE — Telephone Encounter (Signed)
SPOKE TO PATIENT GAVE HER INFORMATION AS NOTED BELOW. Victoria Pace,CMA

## 2016-02-29 NOTE — Telephone Encounter (Signed)
As was discussed at her visit, most likely she is suffering from viral illness which will not be helped with antibiotic treatment, the body fights these infections well in overall healthy people, so I recommend supportive care for now with the prescriptions an dover-the-counter medications we discussed at her visit. If she is not feeling better after about 7-10 days total of being sick, she should let us know.

## 2016-02-29 NOTE — Telephone Encounter (Signed)
I sent alternative cough medicine to take during the day. Please confirm whether she is using the prescription nasal spray as well as the other over-the-counter meds we discussed (DayQuil/NyQuil plus Ibuprofen plus antihistamine). If still worse tomorrow after using all these medications together, please let me know.

## 2016-02-29 NOTE — Telephone Encounter (Signed)
Left message for patient that a cough medication for the day time was sent to pharmacy, and to call to answer other questions.

## 2017-04-24 ENCOUNTER — Emergency Department (INDEPENDENT_AMBULATORY_CARE_PROVIDER_SITE_OTHER)
Admission: EM | Admit: 2017-04-24 | Discharge: 2017-04-24 | Disposition: A | Discharge status: Home / Self Care | Payer: BLUE CROSS/BLUE SHIELD | Source: Home / Self Care | Attending: Family Medicine | Admitting: Family Medicine

## 2017-04-24 ENCOUNTER — Telehealth: Payer: Self-pay | Admitting: Emergency Medicine

## 2017-04-24 ENCOUNTER — Encounter: Payer: Self-pay | Admitting: Emergency Medicine

## 2017-04-24 DIAGNOSIS — Z20818 Contact with and (suspected) exposure to other bacterial communicable diseases: Secondary | ICD-10-CM

## 2017-04-24 DIAGNOSIS — J029 Acute pharyngitis, unspecified: Secondary | ICD-10-CM | POA: Diagnosis not present

## 2017-04-24 LAB — POCT RAPID STREP A (OFFICE): Rapid Strep A Screen: NEGATIVE

## 2017-04-24 NOTE — ED Triage Notes (Signed)
Sore throat x 1.5 days

## 2017-04-24 NOTE — ED Provider Notes (Signed)
CSN: [id number]     Arrival date & time 04/24/17  69 History   First MD Initiated Contact with Patient 04/24/17 1501     Chief Complaint  Patient presents with  . Sore Throat   (Consider location/radiation/quality/duration/timing/severity/associated sxs/prior Treatment) HPI  Victoria Pace is a 36 y.o. female presenting to UC with c/o sore throat for 1.5 days.  She notes her daughter was recently dx with strep throat. She has taken some ibuprofen with mild to moderate temporary relief. Pain is 5/10 at this time.  Denies fever, chills, n/v/d. Denies difficulty breathing or swallowing.   History reviewed. No pertinent past medical history. Past Surgical History:  Procedure Laterality Date  . CESAREAN SECTION  05/24/2011   Family History  Problem Relation Age of Onset  . Prostate cancer Unknown   . Diabetes Father   . Cancer Father    Social History  Substance Use Topics  . Smoking status: Never Smoker  . Smokeless tobacco: Never Used  . Alcohol use No   OB History    No data available     Review of Systems  Constitutional: Negative for chills and fever.  HENT: Positive for sore throat. Negative for congestion, ear pain, trouble swallowing and voice change.   Respiratory: Negative for cough and shortness of breath.   Cardiovascular: Negative for chest pain and palpitations.  Gastrointestinal: Negative for abdominal pain, diarrhea, nausea and vomiting.  Musculoskeletal: Negative for arthralgias, back pain and myalgias.  Skin: Negative for rash.  Neurological: Negative for dizziness, light-headedness and headaches.    Allergies  Patient has no known allergies.  Home Medications   Prior to Admission medications   Not on File   Meds Ordered and Administered this Visit  Medications - No data to display  BP 100/71 (BP Location: Left Arm)   Pulse 77   Temp 99 F (37.2 C) (Oral)   Ht 5\' 8"  (1.727 m)   Wt 179 lb (81.2 kg)   SpO2 98%   BMI 27.22 kg/m  No data  found.   Physical Exam  Constitutional: She is oriented to person, place, and time. She appears well-developed and well-nourished. No distress.  HENT:  Head: Normocephalic and atraumatic.  Right Ear: Tympanic membrane normal.  Left Ear: Tympanic membrane normal.  Nose: Nose normal.  Mouth/Throat: Uvula is midline and mucous membranes are normal. Posterior oropharyngeal erythema present. No oropharyngeal exudate, posterior oropharyngeal edema or tonsillar abscesses.  Eyes: EOM are normal.  Neck: Normal range of motion. Neck supple.  Cardiovascular: Normal rate and regular rhythm.   Pulmonary/Chest: Effort normal and breath sounds normal. No stridor. No respiratory distress. She has no wheezes. She has no rales.  Musculoskeletal: Normal range of motion.  Lymphadenopathy:    She has cervical adenopathy.  Neurological: She is alert and oriented to person, place, and time.  Skin: Skin is warm and dry. She is not diaphoretic.  Psychiatric: She has a normal mood and affect. Her behavior is normal.  Nursing note and vitals reviewed.   Urgent Care Course     Procedures (including critical care time)  Labs Review Labs Reviewed  STREP A DNA PROBE    Imaging Review No results found.   MDM   1. Sore throat   2. Exposure to strep throat    Sore throat. Exposure to strep. Exam: mild erythema, otherwise unremarkable  Rapid strep: Negative Culture sent  Encouraged fluids, rest, acetaminophen, ibuprofen and saltwater gargles. f/u with PCP as needed.  Noland Fordyce, PA-C 04/24/17 1606

## 2017-04-25 ENCOUNTER — Telehealth (INDEPENDENT_AMBULATORY_CARE_PROVIDER_SITE_OTHER): Payer: BLUE CROSS/BLUE SHIELD | Admitting: Emergency Medicine

## 2017-04-25 ENCOUNTER — Telehealth: Payer: Self-pay | Admitting: Emergency Medicine

## 2017-04-25 DIAGNOSIS — Z20818 Contact with and (suspected) exposure to other bacterial communicable diseases: Secondary | ICD-10-CM

## 2017-04-25 DIAGNOSIS — J029 Acute pharyngitis, unspecified: Secondary | ICD-10-CM

## 2017-04-25 LAB — POCT RAPID STREP A (OFFICE): Rapid Strep A Screen: NEGATIVE

## 2017-04-25 NOTE — Telephone Encounter (Signed)
To add order

## 2017-04-25 NOTE — Telephone Encounter (Signed)
Encounter opened to add order from visit 04-24-17.

## 2017-05-10 LAB — HM PAP SMEAR: HM Pap smear: NEGATIVE

## 2017-07-24 ENCOUNTER — Ambulatory Visit (INDEPENDENT_AMBULATORY_CARE_PROVIDER_SITE_OTHER): Payer: PRIVATE HEALTH INSURANCE | Admitting: Osteopathic Medicine

## 2017-07-24 ENCOUNTER — Encounter: Payer: Self-pay | Admitting: Osteopathic Medicine

## 2017-07-24 VITALS — BP 91/54 | HR 68 | Wt 182.0 lb

## 2017-07-24 DIAGNOSIS — Z0000 Encounter for general adult medical examination without abnormal findings: Secondary | ICD-10-CM | POA: Diagnosis not present

## 2017-07-24 DIAGNOSIS — Z111 Encounter for screening for respiratory tuberculosis: Secondary | ICD-10-CM

## 2017-07-24 DIAGNOSIS — Z975 Presence of (intrauterine) contraceptive device: Secondary | ICD-10-CM | POA: Diagnosis not present

## 2017-07-24 NOTE — Progress Notes (Signed)
HPI: Victoria Pace is a 36 y.o. female  who presents to [facility] today, 07/24/17,  for chief complaint of:  Chief Complaint  Patient presents with  . Establish Care    Patient here for annual physical / wellness exam.  See preventive care reviewed as below.  Recent labs reviewed in detail with the patient.   Additional concerns today include:  Needs TB test and forms filled out for work.  [id number] - no complications with pregnancies    Past medical, surgical, social and family history reviewed: Patient Active Problem List   Diagnosis Date Noted  . Nevus 03/16/2015  . Strep pharyngitis 02/28/2015  . ANEMIA 03/04/2011   Past Surgical History:  Procedure Laterality Date  . CESAREAN SECTION  05/24/2011   Social History  Substance Use Topics  . Smoking status: Never Smoker  . Smokeless tobacco: Never Used  . Alcohol use No   Family History  Problem Relation Age of Onset  . Prostate cancer Unknown   . Diabetes Father   . Cancer Father      Current medication list and allergy/intolerance information reviewed:   No current outpatient prescriptions on file.   No current facility-administered medications for this visit.    No Known Allergies    Review of Systems:  Constitutional:  No  fever, no chills, No recent illness, No unintentional weight changes  HEENT: No  headache, no vision change  Cardiac: No  chest pain, No  pressure, No palpitation  Respiratory:  No  shortness of breath. No  Cough  Gastrointestinal: No  abdominal pain  Musculoskeletal: No new myalgia/arthralgia  Hem/Onc: No  easy bruising/bleeding,   Neurologic: No  weakness, No  dizziness  Psychiatric: No  concerns with depression, No  concerns with anxiety, No sleep problems, No mood problems  Exam:  BP (!) 91/54   Pulse 68   Wt 182 lb (82.6 kg)   BMI 27.67 kg/m   Constitutional: VS see above. General Appearance: alert, well-developed,  well-nourished, NAD  Eyes: Normal lids and conjunctive, non-icteric sclera  Ears, Nose, Mouth, Throat: MMM, Normal external inspection ears/nares/mouth/lips/gums. TM normal bilaterally. Pharynx/tonsils no erythema, no exudate. Nasal mucosa normal.   Neck: No masses, trachea midline. No thyroid enlargement. No tenderness/mass appreciated. No lymphadenopathy  Respiratory: Normal respiratory effort. no wheeze, no rhonchi, no rales  Cardiovascular: S1/S2 normal, no murmur, no rub/gallop auscultated. RRR. No lower extremity edema. P  Gastrointestinal: Nontender, no masses. No hepatomegaly, no splenomegaly. No hernia appreciated. Bowel sounds normal. Rectal exam deferred.   Musculoskeletal: Gait normal. No clubbing/cyanosis of digits.   Neurological: Normal balance/coordination. No tremor.   Skin: warm, dry, intact. No rash/ulcer. No concerning nevi or subq nodules on limited exam.    Psychiatric: Normal judgment/insight. Normal mood and affect. Oriented x3.   ASSESSMENT/PLAN:   Annual physical exam  Screening-pulmonary TB - Plan: PPD  IUD (intrauterine device) in place   Patient declined any additional blood work today for lipid screening, diabetes screening.   Patient Instructions  Plan:  TB test (PPD) placed today. On Saturday (in 48+ hours, after 2:30 pm) go to Urgent Care next door to have this interpreted. We have a copy of your form, but keep the original just in case. As long as PPD is negative, there should be no issue for whoever is covering my in-basket on Monday - the form can be signed and ready for you to pick up at the front desk.   We  will request records from [place] re: recent Pap and tetanus shot  Any other questions or concerns, let us know!      FEMALE PREVENTIVE CARE Updated 07/24/17   ANNUAL SCREENING/COUNSELING  Diet/Exercise - HEALTHY HABITS DISCUSSED TO DECREASE CV RISK History  Smoking Status  . Never Smoker  Smokeless Tobacco  . Never Used    History  Alcohol Use No   No flowsheet data found.  Domestic violence concerns - no  HTN SCREENING - SEE [place]  Sexually active in the past year - Yes with female.  Need/want STI testing today? - no  Concerns about libido or pain with sex? - no  Plans for pregnancy? - none desired  INFECTIOUS DISEASE SCREENING  HIV - needs - declined  GC/CT - does not need  HepC - DOB [birth year] - does not need  TB - does not need  DISEASE SCREENING  Lipid - needs - declined  DM2 - needs - declined  Osteoporosis - women age 68+ - does not need  CANCER SCREENING  Cervical - does not need - following with OBGYN  Breast - does not need  Lung - does not need  Colon - does not need  ADULT VACCINATION  Influenza - annual vaccine recommended  Td - booster every 10 years   Zoster - option at 50, yes at 60+   PCV13 - was not indicated  PPSV23 - was not indicated Immunization History  Administered Date(s) Administered  . Influenza-Unspecified 10/16/2014   OTHER  Fall - exercise and Vit D age 4+ - does not need  Consider ASA - age 69-59 - does not need    Visit summary with medication list and pertinent instructions was printed for patient to review. All questions at time of visit were answered - patient instructed to contact office with any additional concerns. ER/RTC precautions were reviewed with the patient. Follow-up plan: Return in about 1 year (around 07/24/2018) for annual physical, sooner if needed.

## 2017-07-24 NOTE — Patient Instructions (Addendum)
Plan:  TB test (PPD) placed today. On Saturday (in 48+ hours, after 2:30 pm) go to Urgent Care next door to have this interpreted. We have a copy of your form, but keep the original just in case. As long as PPD is negative, there should be no issue for whoever is covering my in-basket on Monday - the form can be signed and ready for you to pick up at the front desk.   We will request records from [place] re: recent Pap and tetanus shot  Any other questions or concerns, let us know!

## 2017-07-26 ENCOUNTER — Emergency Department
Admission: EM | Admit: 2017-07-26 | Discharge: 2017-07-26 | Disposition: A | Discharge status: Home / Self Care | Payer: Self-pay | Source: Home / Self Care

## 2017-07-26 NOTE — ED Triage Notes (Signed)
Elgie is here for a PPD read. Denies any problems with injection.    PPD - TB screening - PPD was negative with 0 mm.

## 2017-08-07 ENCOUNTER — Ambulatory Visit (INDEPENDENT_AMBULATORY_CARE_PROVIDER_SITE_OTHER): Payer: PRIVATE HEALTH INSURANCE | Admitting: Osteopathic Medicine

## 2017-08-07 ENCOUNTER — Encounter: Payer: Self-pay | Admitting: Osteopathic Medicine

## 2017-08-07 VITALS — BP 103/71 | HR 73 | Wt 185.0 lb

## 2017-08-07 DIAGNOSIS — N3001 Acute cystitis with hematuria: Secondary | ICD-10-CM | POA: Diagnosis not present

## 2017-08-07 DIAGNOSIS — Z23 Encounter for immunization: Secondary | ICD-10-CM

## 2017-08-07 DIAGNOSIS — R3129 Other microscopic hematuria: Secondary | ICD-10-CM

## 2017-08-07 DIAGNOSIS — R350 Frequency of micturition: Secondary | ICD-10-CM

## 2017-08-07 LAB — POCT URINALYSIS DIPSTICK
BILIRUBIN UA: NEGATIVE
Glucose, UA: NEGATIVE
Ketones, UA: NEGATIVE
NITRITE UA: NEGATIVE
PH UA: 5.5 (ref 5.0–8.0)
Protein, UA: NEGATIVE
UROBILINOGEN UA: 0.2 U/dL

## 2017-08-07 MED ORDER — NITROFURANTOIN MONOHYD MACRO 100 MG PO CAPS: 100.0000 mg | ORAL_CAPSULE | Freq: Two times a day (BID) | ORAL | 0 refills | Status: DC

## 2017-08-07 NOTE — Progress Notes (Signed)
HPI: Victoria Pace is a 36 y.o. female  who presents to [facility] today, 08/07/17,  for chief complaint of:  Chief Complaint  Patient presents with  . Urinary Frequency     . Quality: urinary frequency and burning with urination, urine odor  . Severity: better today - almost cancelled appt but symptoms not toally gone yet  . Duration: 3-4 days   Past medical history, surgical history, social history and family history reviewed.  Patient Active Problem List   Diagnosis Date Noted  . IUD (intrauterine device) in place 07/24/2017  . Nevus 03/16/2015  . Strep pharyngitis 02/28/2015  . ANEMIA 03/04/2011    Current medication list and allergy/intolerance information reviewed.   No current outpatient prescriptions on file prior to visit.   No current facility-administered medications on file prior to visit.    No Known Allergies    Review of Systems:  Constitutional: No recent illness, no fever/chills  Gastrointestinal: No  abdominal pain, no change on bowel habits, no nausea  GU: No abnormal vaginal discharge   Exam:  BP 103/71   Pulse 73   Wt 185 lb (83.9 kg)   BMI 28.13 kg/m   Constitutional: VS see above. General Appearance: alert, well-developed, well-nourished, NAD  Psychiatric: Normal judgment/insight. Normal mood and affect. Oriented x3.   Results for orders placed or performed in visit on 08/07/17 (from the past 24 hour(s))  POCT Urinalysis Dipstick     Status: Abnormal   Collection Time: 08/07/17  2:56 PM  Result Value Ref Range   Color, UA YELOW    Clarity, UA CLEAR    Glucose, UA NEGATIVE    Bilirubin, UA NEGATIVE    Ketones, UA NEGATIVE    Spec Grav, UA >=1.030 (A) 1.010 - 1.025   Blood, UA MODERATE    pH, UA 5.5 5.0 - 8.0   Protein, UA NEGATIVE    Urobilinogen, UA 0.2 0.2 or 1.0 E.U./dL   Nitrite, UA NEGATIVE    Leukocytes, UA Trace (A) Negative     ASSESSMENT/PLAN:    Acute cystitis with  hematuria - Plan: Urine Culture, Urine Microscopic, nitrofurantoin, macrocrystal-monohydrate, (MACROBID) 100 MG capsule  Urine frequency - Plan: POCT Urinalysis Dipstick  Other microscopic hematuria - Plan: Urine Culture, Urine Microscopic  Need for immunization against influenza - Plan: Flu Vaccine QUAD 36+ mos IM      Follow-up plan: Return if symptoms worsen or fail to improve.  Visit summary with medication list and pertinent instructions was printed for patient to review, alert [place] if any changes needed. All questions at time of visit were answered - patient instructed to contact office with any additional concerns. ER/RTC precautions were reviewed with the patient and understanding verbalized.   Note: Total time spent 15 minutes, greater than 50% of the visit was spent face-to-face counseling and coordinating care for the following: The primary encounter diagnosis was Urine frequency. A diagnosis of Need for immunization against influenza was also pertinent to this visit.Marland Kitchen

## 2017-08-07 NOTE — Patient Instructions (Signed)

## 2017-08-08 LAB — URINALYSIS, MICROSCOPIC ONLY
CASTS: NONE SEEN [LPF]
YEAST: NONE SEEN [HPF]

## 2017-08-09 LAB — URINE CULTURE: Organism ID, Bacteria: NO GROWTH

## 2017-11-28 ENCOUNTER — Encounter: Payer: Self-pay | Admitting: Family Medicine

## 2017-11-28 ENCOUNTER — Ambulatory Visit (INDEPENDENT_AMBULATORY_CARE_PROVIDER_SITE_OTHER): Payer: BC Managed Care – PPO | Admitting: Family Medicine

## 2017-11-28 VITALS — BP 97/64 | HR 68 | Wt 183.0 lb

## 2017-11-28 DIAGNOSIS — L309 Dermatitis, unspecified: Secondary | ICD-10-CM | POA: Diagnosis not present

## 2017-11-28 MED ORDER — TRIAMCINOLONE ACETONIDE 0.1 % EX CREA: 1.0000 "application " | TOPICAL_CREAM | Freq: Two times a day (BID) | CUTANEOUS | 0 refills | Status: DC

## 2017-11-28 NOTE — Progress Notes (Signed)
   Subjective:    Patient ID: Victoria Pace, female    DOB: 11/18/1981, 36 y.o.   MRN: [id number]  HPI 3 days of red rash on her neck and face.  + itchy.  She denies any new soap, lotion, detergents etc.  She has not changed any type of facial moisturizer.  It really is just around her right neck and the lower portion of her face.  She does not have anywhere else on her body.  She has not had any upper respiratory symptoms.  No one else at home has a similar rash.  She is never had any skin problems such as eczema or psoriasis.  Review of Systems     Objective:   Physical Exam  Constitutional: She is oriented to person, place, and time. She appears well-developed and well-nourished.  HENT:  Head: Normocephalic and atraumatic.  Right Ear: External ear normal.  Left Ear: External ear normal.  Nose: Nose normal.  Mouth/Throat: Oropharynx is clear and moist.  TMs and canals are clear.   Eyes: Conjunctivae and EOM are normal. Pupils are equal, round, and reactive to light.  Neck: Neck supple. No thyromegaly present.  Pulmonary/Chest: Effort normal. She has no wheezes.  Lymphadenopathy:    She has no cervical adenopathy.  Neurological: She is alert and oriented to person, place, and time.  Skin: Skin is warm and dry.  Mild erythema with a little bit of fine dry scale over the anterior portion of the neck.  Faint borders.  Also on the posterior portion of the neck.  A little bit just along the jawline.  She also has a little bit of scabbing along the lower lip on the right side.  Psychiatric: She has a normal mood and affect.       Assessment & Plan:  Rash -dermatitis-unclear etiology at this point.  No suspected or new contacts.  She does not really take any prescription medications that have changed recently.  We will treat with topical steroid.  It really does not have the appearance of a fungal rash.  Encouraged her to just apply a thin layer of the triamcinolone and then coat on  lotion.  All if not significantly better after 5 days.

## 2017-11-28 NOTE — Patient Instructions (Signed)
Apply a thin layer of the topical triamcinolone cream, then apply your moisturizer on top.

## 2018-12-07 ENCOUNTER — Encounter: Payer: Self-pay | Admitting: Osteopathic Medicine

## 2018-12-07 ENCOUNTER — Ambulatory Visit (INDEPENDENT_AMBULATORY_CARE_PROVIDER_SITE_OTHER): Payer: BC Managed Care – PPO | Admitting: Osteopathic Medicine

## 2018-12-07 VITALS — BP 101/62 | HR 70 | Temp 98.1°F | Wt 171.2 lb

## 2018-12-07 DIAGNOSIS — J019 Acute sinusitis, unspecified: Secondary | ICD-10-CM

## 2018-12-07 MED ORDER — AMOXICILLIN-POT CLAVULANATE 875-125 MG PO TABS: 1.0000 | ORAL_TABLET | Freq: Two times a day (BID) | ORAL | 0 refills | Status: AC

## 2018-12-07 MED ORDER — IPRATROPIUM BROMIDE 0.06 % NA SOLN
2.0000 | Freq: Four times a day (QID) | NASAL | 1 refills | Status: DC
Start: 2018-12-07 — End: 2019-06-17

## 2018-12-07 NOTE — Progress Notes (Signed)
HPI: Victoria Pace is a 37 y.o. female who  has no past medical history on file.  she presents to [facility] today, 12/07/18,  for chief complaint of: Sick: Congestion  Cough and head pressure/congestion x10 days. Drainage and increased stuffiness in AM. OTC cold/sinus Rx not really helping.     Past medical, surgical, social and family history reviewed and updated as necessary.   Current medication list and allergy/intolerance information reviewed:    Current Outpatient Medications  Medication Sig Dispense Refill  . triamcinolone cream (KENALOG) 0.1 % Apply 1 application topically 2 (two) times daily. 30 g 0   No current facility-administered medications for this visit.     No Known Allergies    Review of Systems:  Constitutional:  No  fever, no chills, +recent illness, No unintentional weight changes. No significant fatigue.   HEENT: No  headache, no vision change, no hearing change, No sore throat, +sinus pressure  Cardiac: No  chest pain, No  pressure, No palpitations  Respiratory:  No  shortness of breath. +Cough  Gastrointestinal: No  abdominal pain, No  nausea, No  vomiting,  No  blood in stool, No  diarrhea  Musculoskeletal: No new myalgia/arthralgia  Skin: No  Rash  Neurologic: No  weakness, No  dizziness  Exam:  BP 101/62 (BP Location: Left Arm, Patient Position: Sitting, Cuff Size: Normal)   Pulse 70   Temp 98.1 F (36.7 C) (Oral)   Wt 171 lb 3.2 oz (77.7 kg)   BMI 26.03 kg/m   Constitutional: VS see above. General Appearance: alert, well-developed, well-nourished, NAD  Eyes: Normal lids and conjunctive, non-icteric sclera  Ears, Nose, Mouth, Throat: MMM, Normal external inspection ears/nares/mouth/lips/gums. TM normal bilaterally, clear effusion bilaterally, no bulging/erythema. Pharynx/tonsils no erythema, no exudate. Nasal mucosa normal.   Neck: No masses, trachea midline. No tenderness/mass appreciated.  No lymphadenopathy  Respiratory: Normal respiratory effort. no wheeze, no rhonchi, no rales  Cardiovascular: S1/S2 normal, no murmur, no rub/gallop auscultated. RRR. No lower extremity edema.   Musculoskeletal: Gait normal.   Neurological: Normal balance/coordination. No tremor.   Skin: warm, dry, intact.   Psychiatric: Normal judgment/insight. Normal mood and affect.      ASSESSMENT/PLAN: The encounter diagnosis was Acute non-recurrent sinusitis, unspecified location.   Meds ordered this encounter  Medications  . amoxicillin-clavulanate (AUGMENTIN) 875-125 MG tablet    Sig: Take 1 tablet by mouth 2 (two) times daily for 7 days.    Dispense:  14 tablet    Refill:  0  . ipratropium (ATROVENT) 0.06 % nasal spray    Sig: Place 2 sprays into both nostrils 4 (four) times daily.    Dispense:  15 mL    Refill:  1    No orders of the defined types were placed in this encounter.   Patient Instructions  Medications & Home Remedies for Upper Respiratory Illness     Aches/Pains, Fever, Headache OTC Acetaminophen (Tylenol) 500 mg tablets - take max 2 tablets (1000 mg) every 6 hours (4 times per day)  OTC Ibuprofen (Motrin) 200 mg tablets - take max 4 tablets (800 mg) every 6 hours*   Sinus Congestion Prescription Atrovent as directed OTC Nasal Saline if desired to rinse OTC Oxymetolazone (Afrin, others) sparing use due to rebound congestion, NEVER use in kids OTC Phenylephrine (Sudafed) 10 mg tablets every 4 hours (or the 12-hour formulation)* OTC Diphenhydramine (Benadryl) 25 mg tablets - take max 2 tablets every 4 hours  Cough & Sore Throat OTC Dextromethorphan (Robitussin, others) - cough suppressant OTC Guaifenesin (Robitussin, Mucinex, others) - expectorant (helps cough up mucus) (Dextromethorphan and Guaifenesin also come in a combination tablet/syrup) OTC Lozenges w/ Benzocaine + Menthol (Cepacol) Honey - as much as you want! Teas which "coat the throat" - look for  ingredients Elm Bark, Licorice Root, Marshmallow Root   Other Prescription Antibiotics if these are necessary for bacterial infection - take ALL, even if you're feeling better  OTC Zinc Lozenges within 24 hours of symptoms onset - mixed evidence this shortens the duration of the common cold Don't waste your money on Vitamin C or Echinacea in acute illness - it's already too late!    *Caution in patients with high blood pressure       Visit summary with medication list and pertinent instructions was printed for patient to review. All questions at time of visit were answered - patient instructed to contact office with any additional concerns or updates. ER/RTC precautions were reviewed with the patient.   Follow-up plan: Return if symptoms worsen or fail to improve.     Please note: voice recognition software was used to produce this document, and typos may escape review. Please contact Dr. Sheppard Coil for any needed clarifications.

## 2018-12-07 NOTE — Patient Instructions (Signed)
Medications & Home Remedies for Upper Respiratory Illness     Aches/Pains, Fever, Headache OTC Acetaminophen (Tylenol) 500 mg tablets - take max 2 tablets (1000 mg) every 6 hours (4 times per day)  OTC Ibuprofen (Motrin) 200 mg tablets - take max 4 tablets (800 mg) every 6 hours*   Sinus Congestion Prescription Atrovent as directed OTC Nasal Saline if desired to rinse OTC Oxymetolazone (Afrin, others) sparing use due to rebound congestion, NEVER use in kids OTC Phenylephrine (Sudafed) 10 mg tablets every 4 hours (or the 12-hour formulation)* OTC Diphenhydramine (Benadryl) 25 mg tablets - take max 2 tablets every 4 hours   Cough & Sore Throat OTC Dextromethorphan (Robitussin, others) - cough suppressant OTC Guaifenesin (Robitussin, Mucinex, others) - expectorant (helps cough up mucus) (Dextromethorphan and Guaifenesin also come in a combination tablet/syrup) OTC Lozenges w/ Benzocaine + Menthol (Cepacol) Honey - as much as you want! Teas which "coat the throat" - look for ingredients Elm Bark, Licorice Root, Marshmallow Root   Other Prescription Antibiotics if these are necessary for bacterial infection - take ALL, even if you're feeling better  OTC Zinc Lozenges within 24 hours of symptoms onset - mixed evidence this shortens the duration of the common cold Don't waste your money on Vitamin C or Echinacea in acute illness - it's already too late!    *Caution in patients with high blood pressure

## 2019-06-17 ENCOUNTER — Other Ambulatory Visit: Payer: Self-pay

## 2019-06-17 ENCOUNTER — Encounter: Payer: Self-pay | Admitting: Osteopathic Medicine

## 2019-06-17 ENCOUNTER — Ambulatory Visit (INDEPENDENT_AMBULATORY_CARE_PROVIDER_SITE_OTHER): Payer: BC Managed Care – PPO | Admitting: Osteopathic Medicine

## 2019-06-17 VITALS — BP 93/65 | HR 62 | Temp 98.2°F | Wt 177.2 lb

## 2019-06-17 DIAGNOSIS — M26622 Arthralgia of left temporomandibular joint: Secondary | ICD-10-CM

## 2019-06-17 DIAGNOSIS — H6982 Other specified disorders of Eustachian tube, left ear: Secondary | ICD-10-CM | POA: Diagnosis not present

## 2019-06-17 MED ORDER — IPRATROPIUM BROMIDE 0.06 % NA SOLN: 2.0000 | Freq: Four times a day (QID) | NASAL | 1 refills | Status: DC

## 2019-06-17 MED ORDER — AMOXICILLIN-POT CLAVULANATE 875-125 MG PO TABS: 1.0000 | ORAL_TABLET | Freq: Two times a day (BID) | ORAL | 0 refills | Status: AC

## 2019-06-17 NOTE — Progress Notes (Signed)
HPI: Victoria Pace is a 38 y.o. female who  has no past medical history on file.  she presents to [facility] today, 06/17/19,  for chief complaint of:  Ear problem    . Context: swimming recently  . Location: L ear . Quality: feels like pressure/clogged . Duration: 2-3 days  . Timing: constant  . Modifying factors: worse with chewing  . Assoc signs/symptoms: jaw soreness    At today's visit 06/17/19 ... PMH, PSH, FH reviewed and updated as needed.  Current medication list and allergy/intolerance hx reviewed and updated as needed. (See remainder of HPI, ROS, Phys Exam below)   No results found.  No results found for this or any previous visit (from the past 72 hour(s)).        ASSESSMENT/PLAN: The primary encounter diagnosis was Eustachian tube dysfunction, left. A diagnosis of Arthralgia of left temporomandibular joint was also pertinent to this visit.   Meds ordered this encounter  Medications  . amoxicillin-clavulanate (AUGMENTIN) 875-125 MG tablet    Sig: Take 1 tablet by mouth 2 (two) times daily for 7 days.    Dispense:  14 tablet    Refill:  0  . DISCONTD: ipratropium (ATROVENT) 0.06 % nasal spray    Sig: Place 2 sprays into both nostrils 4 (four) times daily.    Dispense:  15 mL    Refill:  1  . ipratropium (ATROVENT) 0.06 % nasal spray    Sig: Place 2 sprays into both nostrils 4 (four) times daily.    Dispense:  15 mL    Refill:  1    Patient Instructions  For ears:  I do not see any wax that would be causing a clog of the external canal.  In the middle ear/behind the eardrum, I do see some ear fluid that might be contributing to this pressure type feeling.  This is typically due to pressure buildup from something called eustachian tube dysfunction, meaning the middle ear is unable to drain.  We can usually fix this with a nasal spray called Atrovent, I would also add over-the-counter antihistamine such as  Claritin/Allegra/Zyrtec plus or minus Sudafed, any of their generics are fine.   This does not look like swimmer's ear but if you are in the water a lot, that may irritate the ear canal.  After getting out of the pool, can put a few drops of rubbing alcohol in the ear canal and then would use a blow dryer for a minute or two on either side.    If canal starts to feel dry/itchy, can add a few drops of mineral oil like what you would find in the laxative section of the pharmacy.   Printed prescription for antibiotics in case this gets dramatically worse over the weekend.  There is any component of irritation of the TMJ/temporomandibular joint (jawbone) can try taking anti-inflammatories such as ibuprofen or Aleve.         Follow-up plan: Return if symptoms worsen or fail to improve.                                                 ################################################# ################################################# ################################################# #################################################    No outpatient medications have been marked as taking for the 06/17/19 encounter (Appointment) with Emeterio Reeve, DO.    No Known Allergies  Review of Systems:  Constitutional: No recent illness  HEENT: No  headache, no vision change  Cardiac: No  chest pain, No  pressure, No palpitations  Respiratory:  No  shortness of breath. No  Cough  Neurologic: No  weakness, No  Dizziness   Exam:  BP 93/65 (BP Location: Left Arm, Patient Position: Sitting, Cuff Size: Normal)   Pulse 62   Temp 98.2 F (36.8 C) (Oral)   Wt 177 lb 3.2 oz (80.4 kg)   BMI 26.94 kg/m   Constitutional: VS see above. General Appearance: alert, well-developed, well-nourished, NAD  Eyes: Normal lids and conjunctive, non-icteric sclera  Ears, Nose, Mouth, Throat: TM normal bilaterally w/ clear effusion behind  both  Neck: No masses, trachea midline.   Respiratory: Normal respiratory effort.    Musculoskeletal: Gait normal. Symmetric and independent movement of all extremities  Neurological: Normal balance/coordination. No tremor.  Skin: warm, dry, intact.   Psychiatric: Normal judgment/insight. Normal mood and affect. Oriented x3.       Visit summary with medication list and pertinent instructions was printed for patient to review, patient was advised to alert [place] if any updates are needed. All questions at time of visit were answered - patient instructed to contact office with any additional concerns. ER/RTC precautions were reviewed with the patient and understanding verbalized.      Please note: voice recognition software was used to produce this document, and typos may escape review. Please contact Dr. Sheppard Coil for any needed clarifications.    Follow up plan: Return if symptoms worsen or fail to improve.

## 2019-06-17 NOTE — Patient Instructions (Signed)
For ears:  I do not see any wax that would be causing a clog of the external canal.  In the middle ear/behind the eardrum, I do see some ear fluid that might be contributing to this pressure type feeling.  This is typically due to pressure buildup from something called eustachian tube dysfunction, meaning the middle ear is unable to drain.  We can usually fix this with a nasal spray called Atrovent, I would also add over-the-counter antihistamine such as Claritin/Allegra/Zyrtec plus or minus Sudafed, any of their generics are fine.   This does not look like swimmer's ear but if you are in the water a lot, that may irritate the ear canal.  After getting out of the pool, can put a few drops of rubbing alcohol in the ear canal and then would use a blow dryer for a minute or two on either side.    If canal starts to feel dry/itchy, can add a few drops of mineral oil like what you would find in the laxative section of the pharmacy.   Printed prescription for antibiotics in case this gets dramatically worse over the weekend.  There is any component of irritation of the TMJ/temporomandibular joint (jawbone) can try taking anti-inflammatories such as ibuprofen or Aleve.

## 2020-03-02 ENCOUNTER — Encounter: Payer: Self-pay | Admitting: Osteopathic Medicine

## 2020-03-02 NOTE — Progress Notes (Signed)
Negative for intraepithelial lesion or malignancy.  

## 2020-03-28 ENCOUNTER — Ambulatory Visit (INDEPENDENT_AMBULATORY_CARE_PROVIDER_SITE_OTHER): Payer: BC Managed Care – PPO | Admitting: Nurse Practitioner

## 2020-03-28 ENCOUNTER — Other Ambulatory Visit: Payer: Self-pay

## 2020-03-28 ENCOUNTER — Telehealth: Payer: Self-pay

## 2020-03-28 ENCOUNTER — Encounter: Payer: Self-pay | Admitting: Nurse Practitioner

## 2020-03-28 VITALS — BP 102/72 | HR 70 | Temp 98.2°F | Ht 68.0 in | Wt 176.4 lb

## 2020-03-28 DIAGNOSIS — H6690 Otitis media, unspecified, unspecified ear: Secondary | ICD-10-CM

## 2020-03-28 MED ORDER — AMOXICILLIN-POT CLAVULANATE ER 1000-62.5 MG PO TB12: 2.0000 | ORAL_TABLET | Freq: Two times a day (BID) | ORAL | 0 refills | Status: AC

## 2020-03-28 MED ORDER — FLUCONAZOLE 150 MG PO TABS: 150.0000 mg | ORAL_TABLET | Freq: Every day | ORAL | 1 refills | Status: DC

## 2020-03-28 NOTE — Progress Notes (Signed)
Acute Office Visit  Subjective:    Patient ID: Victoria Pace, female    DOB: 23-May-1981, 39 y.o.   MRN: [id number]  Chief Complaint  Patient presents with  . Otalgia    left; onset:1 wk ago, fullness, pain, was seen at a med center out of town and prescribed amoxicillin 875 bid, atbx does not seem to be helping    HPI Patient is in today for left ear pain and pressure for the past week. She reports sudden onset of allergy symptoms about 2 weeks ago, which resolved with over the counter allergy medication. She then flew to [place] to visit family and began to experience inner ear pain in her left ear last Tuesday. She visited a provider in [place] and was diagnosed with otitis media and prescribed amoxicillin '875mg'$  BID x 10 days. She started the antibiotic on Friday, April 9 and took two doses that day. She has now had 5 completed days of antibiotics (10 doses), but feel her symptoms are not improving at all.   She reports left ear pressure, pain, fullness, and muffled hearing. She denies sinus pain/pressure, fever, chills, eye symptoms, or right ear symptoms.   She has not tried anything other than the amoxicillin for treatment.   History reviewed. No pertinent past medical history.  Past Surgical History:  Procedure Laterality Date  . CESAREAN SECTION  05/24/2011    Family History  Problem Relation Age of Onset  . Prostate cancer Unknown   . Diabetes Father   . Cancer Father     Social History   Socioeconomic History  . Marital status: Married    Spouse name: Not on file  . Number of children: Not on file  . Years of education: Not on file  . Highest education level: Not on file  Occupational History  . Not on file  Tobacco Use  . Smoking status: Never Smoker  . Smokeless tobacco: Never Used  Substance and Sexual Activity  . Alcohol use: No    Alcohol/week: 0.0 standard drinks  . Drug use: No  . Sexual activity: Yes    Partners: Male  Other Topics Concern  .  Not on file  Social History Narrative  . Not on file   Social Determinants of Health   Financial Resource Strain:   . Difficulty of Paying Living Expenses:   Food Insecurity:   . Worried About Charity fundraiser in the Last Year:   . Arboriculturist in the Last Year:   Transportation Needs:   . Film/video editor (Medical):   Marland Kitchen Lack of Transportation (Non-Medical):   Physical Activity:   . Days of Exercise per Week:   . Minutes of Exercise per Session:   Stress:   . Feeling of Stress :   Social Connections:   . Frequency of Communication with Friends and Family:   . Frequency of Social Gatherings with Friends and Family:   . Attends Religious Services:   . Active Member of Clubs or Organizations:   . Attends Archivist Meetings:   Marland Kitchen Marital Status:   Intimate Partner Violence:   . Fear of Current or Ex-Partner:   . Emotionally Abused:   Marland Kitchen Physically Abused:   . Sexually Abused:     Outpatient Medications Prior to Visit  Medication Sig Dispense Refill  . amoxicillin (AMOXIL) 875 MG tablet Take 1 tablet by mouth in the morning and at bedtime.    Marland Kitchen levonorgestrel (MIRENA, 52 MG,)  20 MCG/24HR IUD Mirena 20 mcg/24 hours (6 yrs) 52 mg intrauterine device  Take 1 device by intrauterine route.    Marland Kitchen ipratropium (ATROVENT) 0.06 % nasal spray Place 2 sprays into both nostrils 4 (four) times daily. (Patient not taking: Reported on 03/28/2020) 15 mL 1   No facility-administered medications prior to visit.    No Known Allergies  Review of Systems  Constitutional: Negative for activity change, chills, diaphoresis, fatigue and fever.  HENT: Positive for ear pain and hearing loss. Negative for congestion, ear discharge, facial swelling, mouth sores, postnasal drip, rhinorrhea, sinus pressure, sinus pain, sneezing, sore throat, tinnitus and trouble swallowing.   Eyes: Negative for pain, discharge and itching.  Respiratory: Negative for cough and shortness of breath.    Gastrointestinal: Negative for diarrhea, nausea and vomiting.  Neurological: Negative for dizziness and headaches.       Objective:    Physical Exam Vitals and nursing note reviewed.  Constitutional:      Appearance: Normal appearance.  HENT:     Head: Normocephalic.     Right Ear: Tympanic membrane, ear canal and external ear normal. There is no impacted cerumen.     Left Ear: Decreased hearing noted. Swelling and tenderness present. No drainage. No foreign body. No mastoid tenderness. Tympanic membrane is injected, erythematous and bulging. Tympanic membrane is not perforated. Tympanic membrane has decreased mobility.     Nose: Nose normal.     Mouth/Throat:     Mouth: Mucous membranes are moist.     Pharynx: Oropharynx is clear.  Eyes:     Extraocular Movements: Extraocular movements intact.     Conjunctiva/sclera: Conjunctivae normal.     Pupils: Pupils are equal, round, and reactive to light.  Cardiovascular:     Rate and Rhythm: Normal rate.  Pulmonary:     Effort: Pulmonary effort is normal.  Musculoskeletal:        General: Normal range of motion.     Cervical back: Normal range of motion.  Skin:    General: Skin is warm and dry.  Neurological:     General: No focal deficit present.     Mental Status: She is alert and oriented to person, place, and time.  Psychiatric:        Mood and Affect: Mood normal.        Behavior: Behavior normal.        Thought Content: Thought content normal.        Judgment: Judgment normal.     BP 102/72   Pulse 70   Temp 98.2 F (36.8 C) (Oral)   Ht '5\' 8"'$  (1.727 m)   Wt 176 lb 6.4 oz (80 kg)   LMP  (Exact Date)   SpO2 95%   BMI 26.82 kg/m  Wt Readings from Last 3 Encounters:  03/28/20 176 lb 6.4 oz (80 kg)  06/17/19 177 lb 3.2 oz (80.4 kg)  12/07/18 171 lb 3.2 oz (77.7 kg)    Health Maintenance Due  Topic Date Due  . HIV Screening  Never done    There are no preventive care reminders to display for this  patient.   No results found for: TSH Lab Results  Component Value Date   WBC 5.8 08/19/2009   HGB 13.4 08/19/2009   HCT 40.2 08/19/2009   MCV 89.5 08/19/2009   PLT 252 08/19/2009   No results found for: NA, K, CHLORIDE, CO2, GLUCOSE, BUN, CREATININE, BILITOT, ALKPHOS, AST, ALT, PROT, ALBUMIN, CALCIUM, ANIONGAP, EGFR, GFR  No results found for: CHOL No results found for: HDL No results found for: LDLCALC No results found for: TRIG No results found for: CHOLHDL No results found for: HGBA1C     Assessment & Plan:   1. Acute otitis media, unspecified otitis media type Symptoms and presentation consistent with acute otitis media.  Patient has been treated with 5 days (10 doses) of amoxicillin without relief or resolution of symptoms. Will change medication to high dose Augmentin given the amount of time the patient has experienced symptoms and the presentation of the tympanic membrane.  Patient also provided with diflucan for yeast infection that most often presents with antibiotic use.  Instructions to stop the AMOXICILLIN and start the AUGMENTIN tonight. Use heat and ice to the ear and neck for pain and to facilitate drainage. Patient may also use ibuprofen prescribed by the first provider for pain.  Patient to contact the office on Friday morning if symptoms are not improved.  - amoxicillin-clavulanate (AUGMENTIN XR) 1000-62.5 MG 12 hr tablet; Take 2 tablets by mouth 2 (two) times daily for 7 days.  Dispense: 28 tablet; Refill: 0 - fluconazole (DIFLUCAN) 150 MG tablet; Take 1 tablet (150 mg total) by mouth daily.  Dispense: 1 tablet; Refill: 1   Orma Render, NP

## 2020-03-28 NOTE — Patient Instructions (Signed)

## 2020-03-28 NOTE — Telephone Encounter (Signed)
Victoria Pace called and left a message stating she was seen in the ED for ear pain. She is still having left ear pressure. Unable to reach patient, left voicemail.

## 2020-03-28 NOTE — Telephone Encounter (Signed)
Patient scheduled.

## 2020-04-10 ENCOUNTER — Encounter: Payer: Self-pay | Admitting: Nurse Practitioner

## 2020-05-17 ENCOUNTER — Ambulatory Visit (INDEPENDENT_AMBULATORY_CARE_PROVIDER_SITE_OTHER): Payer: BC Managed Care – PPO | Admitting: Psychiatry

## 2020-05-17 ENCOUNTER — Other Ambulatory Visit: Payer: Self-pay

## 2020-05-17 DIAGNOSIS — F411 Generalized anxiety disorder: Secondary | ICD-10-CM

## 2020-05-17 NOTE — Progress Notes (Signed)
[facility] Counselor Initial Adult Exam  Name: Victoria Pace Date: 05/17/2020 MRN: [id number] DOB: Jun 25, 1981 PCP: Emeterio Reeve, DO  Time spent: 60 minutes  8:00am to 9:00am  Guardian/Payee:  patient   Paperwork requested:  No   Reason for Visit /Presenting Problem/Symptoms:  Anxiety, Indecision, mom of 3 kids "issues", her mom is having issued physically/mentally  Mental Status Exam:   Appearance:   Well Groomed     Behavior:  Appropriate, Sharing and Motivated  Motor:  Normal  Speech/Language:   Clear and Coherent  Affect:  anxious  Mood:  anxious  Thought process:  goal directed  Thought content:    WNL  Sensory/Perceptual disturbances:    WNL  Orientation:  oriented to person, place, time/date, situation, day of week, month of year and year  Attention:  Good/Fair  Concentration:  Good and Fair  Memory:  [place] of knowledge:   Good  Insight:    Good  Judgment:   Good  Impulse Control:  Good   Reported Symptoms:  See symptoms noted above.  Risk Assessment: Danger to Self:  No Self-injurious Behavior: No Danger to Others: No Duty to Warn:no Physical Aggression / Violence:No  Access to Firearms a concern: No  Gang Involvement:No  Patient / guardian was educated about steps to take if suicide or homicide risk level increases between visits: Patient denies any SI. While future psychiatric events cannot be accurately predicted, the patient does not currently require acute inpatient psychiatric care and does not currently meet [facility] involuntary commitment criteria.  Substance Abuse History: Current substance abuse: No     Past Psychiatric History:   Previous psychological history is significant for anxiety and depression Outpatient Providers:in [place] History of Psych Hospitalization: No  Psychological Testing: none   Abuse History: Victim of No., no   Report needed: No. Victim of Neglect:No. Perpetrator of no  Witness / Exposure to  Domestic Violence: No   Protective Services Involvement: No  Witness to [employer] Violence:  No   Family History: Patient confirms info below is correct Family History  Problem Relation Age of Onset  . Prostate cancer Unknown   . Diabetes Father   . Cancer Father     Living situation: the patient lives with their spouse and 3 children (ages 55, 40, 25, 2 girls and a boy) Sexual Orientation:  Straight  Relationship Status: married 16 years Name of spouse / other:n/a             If a parent, number of children / ages: 22, 106, 49 Has 1 brother, age 21 married with baby living in [place]; spouse friends parents  Museum/gallery curator Stress:  No   Income/Employment/Disability: Employment  Armed forces logistics/support/administrative officer: No   Educational History: Education: post Forensic psychologist work or degree   Religion/Sprituality/World View:   Protestant  Any cultural differences that may affect / interfere with treatment:  not applicable   Recreation/Hobbies: walking, gardening, "keeping up with her kids"  Stressors:Educational concerns Other: mom's health concerns  Strengths:  Supportive Relationships, Family, Friends, [place], Spirituality, Hopefulness, Conservator, museum/gallery and Able to Communicate Effectively  Barriers:  myself  Legal History: Pending legal issue / charges: The patient has no significant history of legal issues. History of legal issue / charges: n/a  Medical History/Surgical History:Reviewed and patient confirms info. No past medical history on file.  Past Surgical History:  Procedure Laterality Date  . CESAREAN SECTION  05/24/2011    Medications: Patient confirms info below.  Current Outpatient Medications  Medication Sig Dispense Refill  . fluconazole (DIFLUCAN) 150 MG tablet Take 1 tablet (150 mg total) by mouth daily. 1 tablet 1  . levonorgestrel (MIRENA, 52 MG,) 20 MCG/24HR IUD Mirena 20 mcg/24 hours (6 yrs) 52 mg intrauterine device  Take 1 device by intrauterine  route.     No current facility-administered medications for this visit.    No Known Allergies- no allergies as of 05/17/20  Diagnoses:    ICD-10-CM   1. Generalized anxiety disorder  F41.1     Plan of Care:  Patient not signing Treatment Plan on computer screen due to Covid.  Treatment Goals: Goals remain on Tx Plan as patient works on strategies to achieve her goals.  Progress is noted each session in th "Progress" section of the Plan. (Patient states overall she" wants to be a better parent than her parents, but they did what they felt was best.") (States that "anxiety is my main symptom but also have some depression at times.")  Long term goal: Reduce overall level, frequency, and intensity of the anxiety so that daily functioning is not impaired.  Short term goal: Increase understanding of beliefs and messages that produce worry, anxiety, and depression.  Strategy: Identify, challenge and replace anxious/fearful thoughts and self-talk with positive, reality-based, and empowering thoughts and self-talk.  Progress: Today is patient's initial session and she presents with anxiety, some depression, and motivated. After completing her initial evaluation we worked together on her Treatment Goal Plan, with specific targets that patient menitoned.  Patient is a 39 year old, married for 16 yrs, and marriage seems positive and stable, with stressors that are being addressed. She has a 41 year old married brother who lives in [place], closer to patient's parents.  Patient and husband have 3 children, ages 75, 40, and 62, 2 girls and a boy. 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  There are no financial stressors.   A big stressor for patient right now is her mother's health, per recently diagnosed cognitive impairment "not real bad yet, but noticeable behavior changes."  Testing has been done and there are spots on frontal lobe and they are continuing to watch and evaluate  her. Mother is to return to doctor in August 2021 for further evaluation/testing. Patient reports that "family started noticing some mental decline a couple yrs ago with some confusion, mailing things twice, not helping out as usual, rocking back and forth in a still chair, unusually burning food when cooking, sensory patterns of rubbing her leg, and less depth in conversations.   Goal review and progress to be noted with patient each session.  Next appt within 1-2 weeks.  Shanon Ace, LCSW

## 2020-05-31 ENCOUNTER — Ambulatory Visit (INDEPENDENT_AMBULATORY_CARE_PROVIDER_SITE_OTHER): Payer: BC Managed Care – PPO | Admitting: Psychiatry

## 2020-05-31 ENCOUNTER — Other Ambulatory Visit: Payer: Self-pay

## 2020-05-31 DIAGNOSIS — F411 Generalized anxiety disorder: Secondary | ICD-10-CM | POA: Diagnosis not present

## 2020-05-31 NOTE — Progress Notes (Signed)
[facility] Counselor/Therapist Progress Note  Patient ID: AYLANI SPURLOCK, MRN: [id number],    Date: 05/31/2020  Time Spent: 60 minutes   1100am to 12:00noon  Treatment Type: Individual Therapy  Reported Symptoms: anxiety, stressed  Mental Status Exam:  Appearance:   Casual     Behavior:  Appropriate, Sharing and Motivated  Motor:  Normal  Speech/Language:   Normal Rate  Affect:  anxious  Mood:  anxious  Thought process:  goal directed  Thought content:    WNL  Sensory/Perceptual disturbances:    WNL  Orientation:  oriented to person, place, time/date, situation, day of week, month of year and year  Attention:  Good  Concentration:  Good  Memory:  WNL  Fund of knowledge:   Good  Insight:    Good  Judgment:   Good  Impulse Control:  Good   Risk Assessment: Danger to Self:  No Self-injurious Behavior: No Danger to Others: No Duty to Warn:no Physical Aggression / Violence:No  Access to Firearms a concern: No  Gang Involvement:No   Subjective: Patient today reports anxiety and feeling stressed between juggling care of her 3 young children and supporting her aging mother and other family members as mother is now experiencing some cognitive decline.  Interventions: Cognitive Behavioral Therapy, Solution-Oriented/Positive Psychology and Ego-Supportive  Diagnosis:   ICD-10-CM   1. Generalized anxiety disorder  F41.1      Plan of Care:  Patient not signing Treatment Plan on computer screen due to Covid.  Treatment Goals: Goals remain on Tx Plan as patient works on strategies to achieve her goals.  Progress is noted each session in th "Progress" section of the Plan. (Patient states overall she" wants to be a better parent than her parents, but they did what they felt was best.") (States that "anxiety is my main symptom but also have some depression at times.")  Long term goal: Reduce overall level, frequency, and intensity of the anxiety so that daily  functioning is not impaired.  Short term goal: Increase understanding of beliefs and messages that produce worry, anxiety, and depression.  Strategy: Identify, challenge and replace anxious/fearful thoughts and self-talk with positive, reality-based, and empowering thoughts and self-talk.  Progress: Patient in today very stressed and anxious, mostly about her mom aging and having cognitive decline, and her children and trying to meet all their needs. "Hard to feel like I'm managing everything well". Acknowledges her standards and expectations of herself are very high, too high often times.  She is in stressful job in education.  Caring for her 3 young kids also comes with significant stress due to some current family circumstances.  Patient also trying to absorb the news of her mother's cognitive decline and anxiety/fears for her mom's future. Patient shared more of her anxious thoughts about this today ans we worked on helping her (per strategy above) identify, challenge and replace anxious/fearful thoughts and self-talk with positive, reality-based, and empowering thoughts and self-talk. This seems like a very useful tool for patient.  Also addressed patient's questions about "how much to say to her kids about their grandmother" as they have noticed changes in her.  Seemed more calm and grounded by session end. To continue the more positive self-talk and replacement of anxious/fearful thoughts between sessions.  Goal review and progress/efforts/challenges noted with patient.  Next appt within 2 weeks.   Shanon Ace, LCSW

## 2020-06-28 ENCOUNTER — Ambulatory Visit: Payer: BC Managed Care – PPO | Admitting: Psychiatry

## 2020-07-26 ENCOUNTER — Ambulatory Visit: Payer: BC Managed Care – PPO | Admitting: Psychiatry

## 2020-07-26 ENCOUNTER — Encounter: Payer: Self-pay | Admitting: Nurse Practitioner

## 2020-07-26 DIAGNOSIS — Z7189 Other specified counseling: Secondary | ICD-10-CM

## 2020-07-28 NOTE — Telephone Encounter (Signed)
I do not see a referral placed for me to send anywhere. Are you able to enter referral?

## 2021-01-10 ENCOUNTER — Emergency Department (INDEPENDENT_AMBULATORY_CARE_PROVIDER_SITE_OTHER)
Admission: EM | Admit: 2021-01-10 | Discharge: 2021-01-10 | Disposition: A | Discharge status: Home / Self Care | Payer: BC Managed Care – PPO | Source: Home / Self Care

## 2021-01-10 ENCOUNTER — Emergency Department (INDEPENDENT_AMBULATORY_CARE_PROVIDER_SITE_OTHER): Payer: BC Managed Care – PPO

## 2021-01-10 ENCOUNTER — Other Ambulatory Visit: Payer: Self-pay

## 2021-01-10 DIAGNOSIS — J1282 Pneumonia due to coronavirus disease 2019: Secondary | ICD-10-CM

## 2021-01-10 DIAGNOSIS — R053 Chronic cough: Secondary | ICD-10-CM

## 2021-01-10 DIAGNOSIS — U071 COVID-19: Secondary | ICD-10-CM

## 2021-01-10 MED ORDER — DOXYCYCLINE HYCLATE 100 MG PO CAPS: 100.0000 mg | ORAL_CAPSULE | Freq: Two times a day (BID) | ORAL | 0 refills | Status: AC

## 2021-01-10 MED ORDER — BENZONATATE 100 MG PO CAPS: 100.0000 mg | ORAL_CAPSULE | Freq: Three times a day (TID) | ORAL | 0 refills | Status: DC

## 2021-01-10 NOTE — ED Triage Notes (Signed)
Patient presents to Urgent Care with complaints of continued cough and chest pressure and fatigue since she tested positive for covid on 12/21/2020. Patient reports she just wanted to see if she was "missing something", did start zoloft in the past month and has been feeling more antsy than normal but did lose a parent in the past month.

## 2021-01-10 NOTE — Discharge Instructions (Signed)
  Please take antibiotics as prescribed and be sure to complete entire course even if you start to feel better to ensure infection does not come back.  Call to schedule an appointment with primary care or the post-covid care clinic next week if not improving.   Call 911 or have someone drive you to the hospital if symptoms significantly worsening.

## 2021-01-10 NOTE — ED Provider Notes (Signed)
[facility]    CSN: [id number] Arrival date & time: 01/10/21  1629      History   Chief Complaint Chief Complaint  Patient presents with  . Cough    HPI Victoria Pace is a 40 y.o. female.   HPI  Victoria Pace is a 40 y.o. female presenting to UC with c/o continued cough and chest pressure with fatigue since testing positive with [facility] on 12/21/20.  Pt states cough is better at night when she sleeps on her stomach but states she coughs more during the day as she feels the congestion "settling." denies fever, chills, n/v/d. No cough medication taken PTA. No hx of asthma.    History reviewed. No pertinent past medical history.  Patient Active Problem List   Diagnosis Date Noted  . IUD (intrauterine device) in place 07/24/2017  . Nevus 03/16/2015  . ANEMIA 03/04/2011    Past Surgical History:  Procedure Laterality Date  . CESAREAN SECTION  05/24/2011    OB History   No obstetric history on file.      Home Medications    Prior to Admission medications   Medication Sig Start Date End Date Taking? Authorizing Provider  benzonatate (TESSALON) 100 MG capsule Take 1 capsule (100 mg total) by mouth every 8 (eight) hours. 01/10/21  Yes Xan Ingraham O, PA-C  doxycycline (VIBRAMYCIN) 100 MG capsule Take 1 capsule (100 mg total) by mouth 2 (two) times daily for 7 days. 01/10/21 01/17/21 Yes Buelah Rennie O, PA-C  fluconazole (DIFLUCAN) 150 MG tablet Take 1 tablet (150 mg total) by mouth daily. 03/28/20   Orma Render, NP  levonorgestrel (MIRENA, 52 MG,) 20 MCG/24HR IUD Mirena 20 mcg/24 hours (6 yrs) 52 mg intrauterine device  Take 1 device by intrauterine route.    [provider]    Family History Family History  Problem Relation Age of Onset  . Prostate cancer Other   . Diabetes Father   . Cancer Father   . Healthy Mother     Social History Social History   Tobacco Use  . Smoking status: Never Smoker  . Smokeless tobacco: Never Used   Substance Use Topics  . Alcohol use: No    Alcohol/week: 0.0 standard drinks  . Drug use: No     Allergies   Patient has no known allergies.   Review of Systems Review of Systems  Constitutional: Negative for chills and fever.  HENT: Positive for congestion. Negative for ear pain, sore throat, trouble swallowing and voice change.   Respiratory: Positive for cough and chest tightness. Negative for shortness of breath.   Cardiovascular: Positive for chest pain (soreness). Negative for palpitations.  Gastrointestinal: Negative for abdominal pain, diarrhea, nausea and vomiting.  Musculoskeletal: Negative for arthralgias, back pain and myalgias.  Skin: Negative for rash.  All other systems reviewed and are negative.    Physical Exam Triage Vital Signs ED Triage Vitals  Enc Vitals Group     BP 01/10/21 1718 123/82     Pulse --      Resp 01/10/21 1718 16     Temp 01/10/21 1718 98.1 F (36.7 C)     Temp Source 01/10/21 1718 Oral     SpO2 01/10/21 1718 98 %     Weight --      Height --      Head Circumference --      Peak Flow --      Pain Score 01/10/21 1716 0  Pain Loc --      Pain Edu? --      Excl. in [place]? --    No data found.  Updated Vital Signs BP 123/82 (BP Location: Right Arm)   Temp 98.1 F (36.7 C) (Oral)   Resp 16   LMP 01/07/2021   SpO2 98%   Visual Acuity Right Eye Distance:   Left Eye Distance:   Bilateral Distance:    Right Eye Near:   Left Eye Near:    Bilateral Near:     Physical Exam Vitals and nursing note reviewed.  Constitutional:      General: She is not in acute distress.    Appearance: Normal appearance. She is well-developed and well-nourished. She is not ill-appearing, toxic-appearing or diaphoretic.  HENT:     Head: Normocephalic and atraumatic.     Right Ear: Tympanic membrane and ear canal normal.     Left Ear: Tympanic membrane and ear canal normal.     Nose: Nose normal.     Right Sinus: No maxillary sinus tenderness  or frontal sinus tenderness.     Left Sinus: No maxillary sinus tenderness or frontal sinus tenderness.     Mouth/Throat:     Lips: Pink.     Mouth: Mucous membranes are moist.     Pharynx: Oropharynx is clear. Uvula midline.  Eyes:     Extraocular Movements: EOM normal.  Cardiovascular:     Rate and Rhythm: Normal rate and regular rhythm.  Pulmonary:     Effort: Pulmonary effort is normal. No respiratory distress.     Breath sounds: No stridor. Rhonchi (faint diffuse) present. No wheezing or rales.  Musculoskeletal:        General: Normal range of motion.     Cervical back: Normal range of motion.  Skin:    General: Skin is warm and dry.  Neurological:     Mental Status: She is alert and oriented to person, place, and time.  Psychiatric:        Mood and Affect: Mood and affect normal.        Behavior: Behavior normal.      UC Treatments / Results  Labs (all labs ordered are listed, but only abnormal results are displayed) Labs Reviewed - No data to display  EKG   Radiology DG Chest 2 View  Result Date: 01/10/2021 CLINICAL DATA:  40 year old female with cough and recent diagnosis of COVID. EXAM: CHEST - 2 VIEW COMPARISON:  None. FINDINGS: The cardiomediastinal silhouette is unremarkable. Equivocal mild hazy opacities within both LOWER lungs noted. There is no evidence of pulmonary edema, suspicious pulmonary nodule/mass, pleural effusion, or pneumothorax. No acute bony abnormalities are identified. IMPRESSION: Equivocal mild hazy opacities within both LOWER lungs which may represent infection/pneumonia. Electronically Signed   By: Margarette Canada M.D.   On: 01/10/2021 18:23    Procedures Procedures (including critical care time)  Medications Ordered in UC Medications - No data to display  Initial Impression / Assessment and Plan / UC Course  I have reviewed the triage vital signs and the nursing notes.  Pertinent labs & imaging results that were available during my care  of the patient were reviewed by me and considered in my medical decision making (see chart for details).     Discussed imaging with pt Will tx for secondary bacterial infection F/u with PCP or Post-COVID Care Clinic. Discussed symptoms that warrant emergent care in the ED. AVS given  Final Clinical Impressions(s) / UC Diagnoses  Final diagnoses:  Pneumonia due to COVID-19 virus  Persistent cough for 3 weeks or longer     Discharge Instructions      Please take antibiotics as prescribed and be sure to complete entire course even if you start to feel better to ensure infection does not come back.  Call to schedule an appointment with primary care or the post-covid care clinic next week if not improving.   Call 911 or have someone drive you to the hospital if symptoms significantly worsening.     ED Prescriptions    Medication Sig Dispense Auth. Provider   doxycycline (VIBRAMYCIN) 100 MG capsule Take 1 capsule (100 mg total) by mouth 2 (two) times daily for 7 days. 14 capsule Gerarda Fraction, Yasheka Fossett O, PA-C   benzonatate (TESSALON) 100 MG capsule Take 1 capsule (100 mg total) by mouth every 8 (eight) hours. 21 capsule Noe Gens, [place]     PDMP not reviewed this encounter.   Noe Gens, [place] 01/10/21 1829

## 2021-01-17 ENCOUNTER — Encounter: Payer: Self-pay | Admitting: Osteopathic Medicine

## 2021-01-17 ENCOUNTER — Other Ambulatory Visit: Payer: Self-pay

## 2021-01-17 ENCOUNTER — Ambulatory Visit (INDEPENDENT_AMBULATORY_CARE_PROVIDER_SITE_OTHER): Payer: BC Managed Care – PPO | Admitting: Medical-Surgical

## 2021-01-17 VITALS — BP 108/75 | HR 74 | Temp 98.3°F | Wt 173.1 lb

## 2021-01-17 DIAGNOSIS — F418 Other specified anxiety disorders: Secondary | ICD-10-CM

## 2021-01-17 DIAGNOSIS — U071 COVID-19: Secondary | ICD-10-CM | POA: Diagnosis not present

## 2021-01-17 DIAGNOSIS — J1282 Pneumonia due to coronavirus disease 2019: Secondary | ICD-10-CM

## 2021-01-17 NOTE — Progress Notes (Signed)
Subjective:    CC: Back pain/chest pressure  HPI: Pleasant 40 year old female presenting today with original complaints of back pain/chest pressure.  She was recently found to be Covid positive then diagnosed with Covid pneumonia at an urgent care on 1/26.  She was prescribed 7 days of doxycycline twice daily and benzonatate as needed.  She has completed the doxycycline and presents today because she is continuing to have right upper chest pressure that at times radiates to her back.  Denies shortness of breath, fever, chills, weakness/fatigue, night sweats.  Notes that she recently started Zoloft and hydroxyzine for severe anxiety but she is not tolerating the Zoloft well as it is causing her nausea, decreased appetite, and generalized malaise.  She has taken the Zoloft for approximately 1 week at 25mg  daily. This was prescribed by her OB/GYN who felt her chest pressure is related to anxiety/depression. Of note, she did lose her father approximately 6 months ago and has since been dealing with her mother's care needs as well as the estate issues. She has kids of her own and her level of stress is consistently elevated. She is seeing a therapist. She is significantly worried that there is something wrong with her even though she has had all of her preventative care and completed genetic counseling.   I reviewed the past medical history, family history, social history, surgical history, and allergies today and no changes were needed.  Please see the problem list section below in epic for further details.  Past Medical History: No past medical history on file. Past Surgical History: Past Surgical History:  Procedure Laterality Date  . CESAREAN SECTION  05/24/2011   Social History: Social History   Socioeconomic History  . Marital status: Married    Spouse name: Not on file  . Number of children: Not on file  . Years of education: Not on file  . Highest education level: Not on file   Occupational History  . Not on file  Tobacco Use  . Smoking status: Never Smoker  . Smokeless tobacco: Never Used  Substance and Sexual Activity  . Alcohol use: No    Alcohol/week: 0.0 standard drinks  . Drug use: No  . Sexual activity: Yes    Partners: Male  Other Topics Concern  . Not on file  Social History Narrative  . Not on file   Social Determinants of Health   Financial Resource Strain: Not on file  Food Insecurity: Not on file  Transportation Needs: Not on file  Physical Activity: Not on file  Stress: Not on file  Social Connections: Not on file   Family History: Family History  Problem Relation Age of Onset  . Prostate cancer Other   . Diabetes Father   . Cancer Father   . Healthy Mother    Allergies: No Known Allergies Medications: See med rec.  Review of Systems: See HPI for pertinent positives and negatives.   Objective:    General: Well Developed, well nourished, and in no acute distress. Tearful at times. Visibly anxious.  Neuro: Alert and oriented x3.  HEENT: Normocephalic, atraumatic.  Skin: Warm and dry. Cardiac: Regular rate and rhythm, no murmurs rubs or gallops, no lower extremity edema.  Respiratory: Clear to auscultation bilaterally. Not using accessory muscles, speaking in full sentences.  Impression and Recommendations:    1. Anxiety with depression Suspect that she is experiencing somatic symptoms related to severe anxiety and depression. She has had a lot of life changes in the past  6-8 months and is under quite a bit of stress. Discussed treatment options including continuing the Zoloft for another couple of weeks since the side effects do generally resolve after about 2 weeks of therapy or switching to a different medication that may be better tolerated. She would like to continue the Zoloft at the 25mg  dose for now to see if it gets more tolerable. Ok to use hydroxyzine at 12.5mg  if this helps with anxiety and is not too sedating.    2. Pneumonia due to COVID-19 virus Lung sounds clear today. Imaging reviewed with pneumonia noted in the bases of her lungs and not in the area of pressure. To verify, we will get a repeat chest x-ray in a couple of weeks. This will give time for the antibiotics to do the job and for her body to clear the infection.  - DG Chest 2 View; Future  Return if symptoms worsen or fail to improve. ___________________________________________ Clearnce Sorrel, DNP, APRN, FNP-BC [facility] and [place]

## 2021-01-17 NOTE — Patient Instructions (Signed)
Reasons why it is important to allow yourself to process and experience true feelings: 1. When you numb sadness, you also numb happiness and Vencil Basnett. 2. Struggling with your emotions often leads to more suffering. 3. Processing and experiencing your feelings is a part of having a full life.    Coping skills are things we can do to make ourselves feel better when we are going through difficult times. Examples of coping skills include:  . Take a deep breath . Count to 20 . Listen to music . Call a friend . Take a walk . Read a book . Do a puzzle . Meditate . Journal . Exercise . Stretch . Sing . Bake . Knit . Go outside . Garden . Pray . Color . Send a note . Take a bath . Watch a movie . Pet an animal . Visit a friend . Be alone in a quiet place   When your anxiety gets worse, try these grounding techniques:      If you need additional help, please contact your primary care provider or call one of the resources listed below:  [facility]  24-hour HelpLine at 336-832-9700 or 800-711-2635 [address] [place], [place] [address]  [facility] 800-SUICIDE or 800-784-2433  The National Suicide Prevention Lifeline 800-273-TALK or 800-273-8255  [facility] https://www.celebraterecovery.com/    

## 2021-02-01 ENCOUNTER — Ambulatory Visit (INDEPENDENT_AMBULATORY_CARE_PROVIDER_SITE_OTHER): Payer: BC Managed Care – PPO

## 2021-02-01 ENCOUNTER — Other Ambulatory Visit: Payer: Self-pay

## 2021-02-01 DIAGNOSIS — U071 COVID-19: Secondary | ICD-10-CM

## 2021-02-01 DIAGNOSIS — J1282 Pneumonia due to coronavirus disease 2019: Secondary | ICD-10-CM

## 2021-02-01 DIAGNOSIS — R0789 Other chest pain: Secondary | ICD-10-CM

## 2021-05-28 IMAGING — DX DG CHEST 2V
2 series · 2 of 2 positions shown · non-contrast
Comparison: None.

CLINICAL DATA: 39-year-old female with cough and recent diagnosis
of COVID.

EXAM:
CHEST - 2 VIEW

[chest pa]
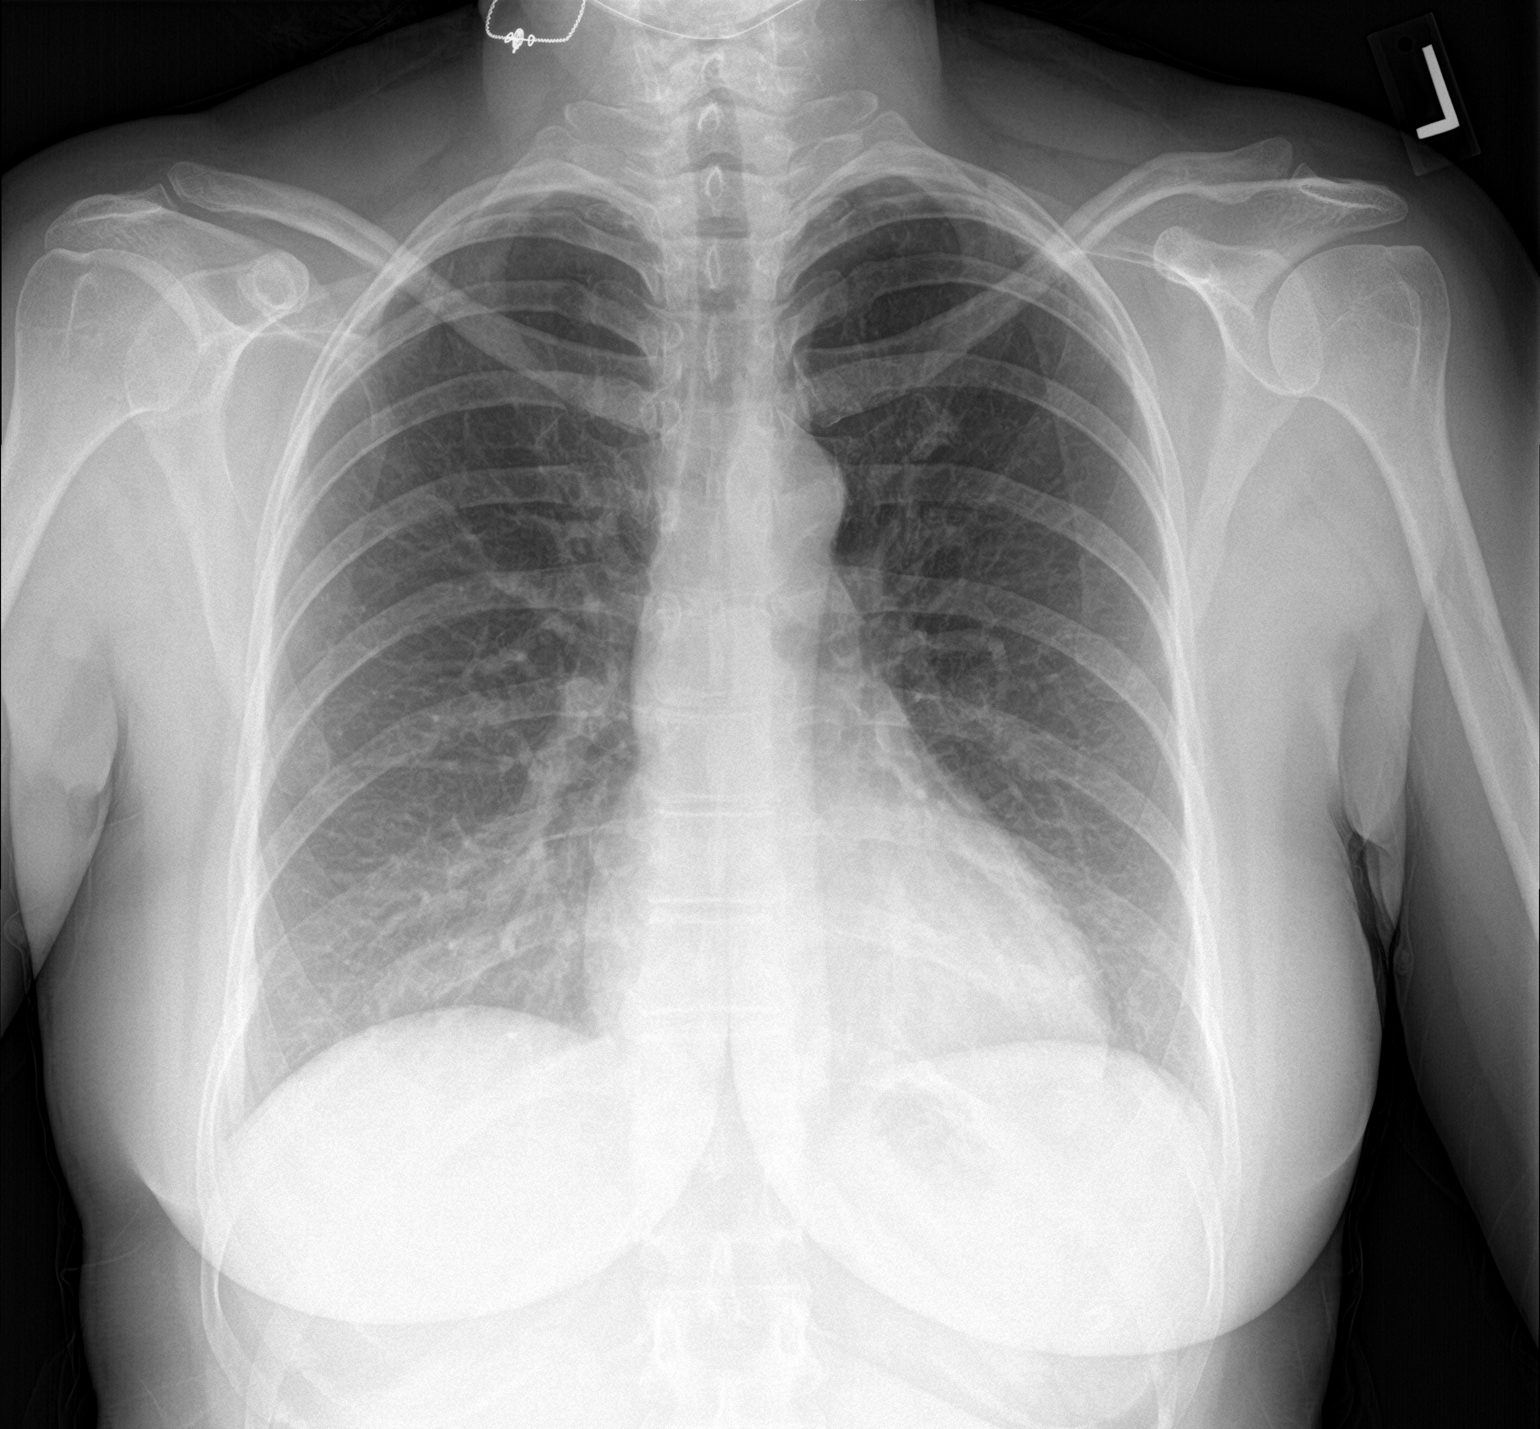

[chest lat]
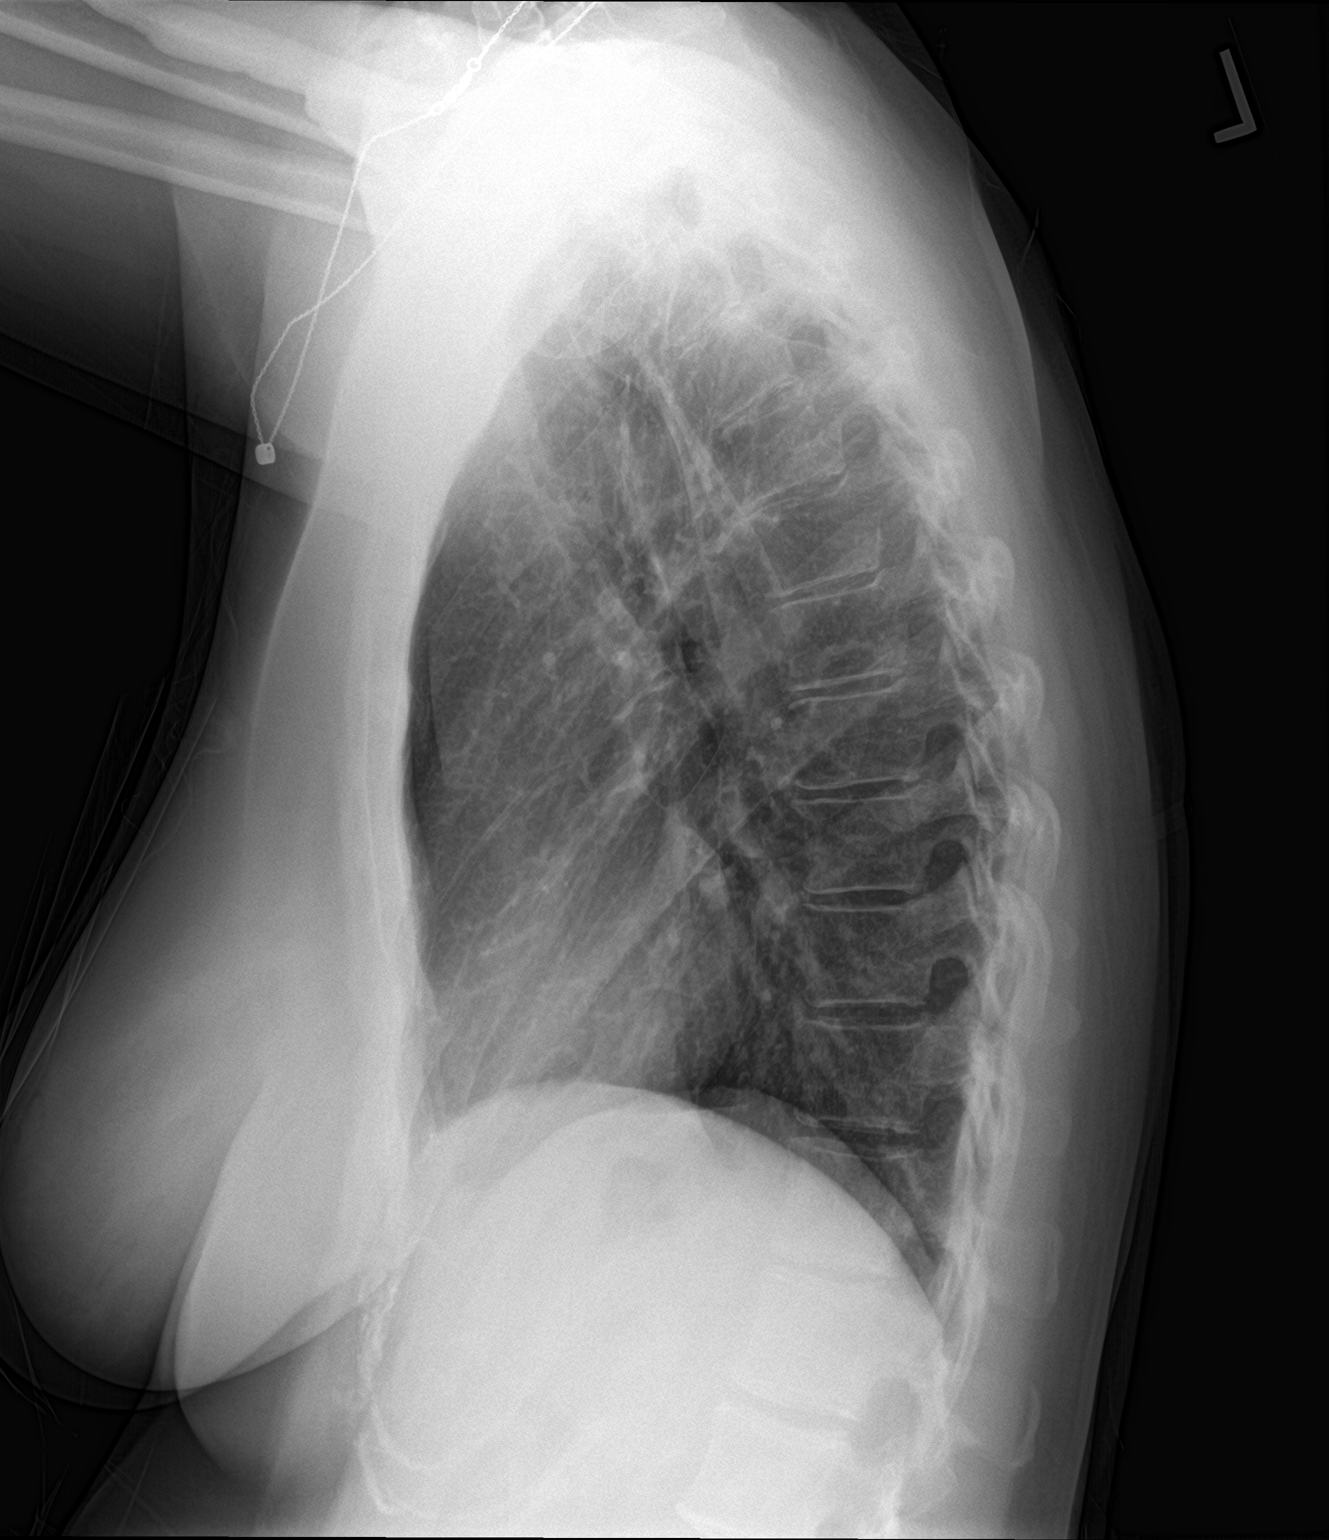

[2 of 2 positions shown; findings below may reference images not displayed]

FINDINGS: The cardiomediastinal silhouette is unremarkable.

Equivocal mild hazy opacities within both LOWER lungs noted.

There is no evidence of pulmonary edema, suspicious pulmonary
nodule/mass, pleural effusion, or pneumothorax.

No acute bony abnormalities are identified.
IMPRESSION: Equivocal mild hazy opacities within both LOWER lungs which may
represent infection/pneumonia.

## 2021-06-27 ENCOUNTER — Ambulatory Visit: Payer: BC Managed Care – PPO | Admitting: Medical-Surgical

## 2022-02-19 ENCOUNTER — Ambulatory Visit: Payer: Self-pay

## 2022-02-19 ENCOUNTER — Emergency Department (INDEPENDENT_AMBULATORY_CARE_PROVIDER_SITE_OTHER)
Admission: EM | Admit: 2022-02-19 | Discharge: 2022-02-19 | Disposition: A | Discharge status: Home / Self Care | Payer: BC Managed Care – PPO | Source: Home / Self Care

## 2022-02-19 ENCOUNTER — Other Ambulatory Visit: Payer: Self-pay

## 2022-02-19 DIAGNOSIS — R300 Dysuria: Secondary | ICD-10-CM

## 2022-02-19 DIAGNOSIS — N3001 Acute cystitis with hematuria: Secondary | ICD-10-CM

## 2022-02-19 LAB — POCT URINALYSIS DIP (MANUAL ENTRY)
Bilirubin, UA: NEGATIVE
Glucose, UA: NEGATIVE mg/dL
Ketones, POC UA: NEGATIVE mg/dL
Nitrite, UA: NEGATIVE
Protein Ur, POC: NEGATIVE mg/dL
Spec Grav, UA: >=1.03 — AB (ref 1.010–1.025)
Urobilinogen, UA: 0.2 E.U./dL
pH, UA: 5.5 (ref 5.0–8.0)

## 2022-02-19 MED ORDER — SULFAMETHOXAZOLE-TRIMETHOPRIM 800-160 MG PO TABS: 1.0000 | ORAL_TABLET | Freq: Two times a day (BID) | ORAL | 0 refills | Status: AC

## 2022-02-19 NOTE — Discharge Instructions (Addendum)
Advised patient to take medication as directed with food to completion.  Encouraged patient to increase daily water intake while taking this medication.  Advised we will follow-up with her once urine culture results are received. ?

## 2022-02-19 NOTE — ED Provider Notes (Signed)
?[facility] ? ? ? ?CSN: [id number] ?Arrival date & time: 02/19/22  1548 ? ? ?  ? ?History   ?Chief Complaint ?Chief Complaint  ?Patient presents with  ? Urinary Frequency  ? Dysuria  ? ? ?HPI ?Victoria Pace is a 41 y.o. female.  ? ?HPI 41 year old female presents with dysuria and urinary frequency x 2 days. ? ?History reviewed. No pertinent past medical history. ? ?Patient Active Problem List  ? Diagnosis Date Noted  ? IUD (intrauterine device) in place 07/24/2017  ? Nevus 03/16/2015  ? ANEMIA 03/04/2011  ? ? ?Past Surgical History:  ?Procedure Laterality Date  ? CESAREAN SECTION  05/24/2011  ? ? ?OB History   ?No obstetric history on file. ?  ? ? ? ?Home Medications   ? ?Prior to Admission medications   ?Medication Sig Start Date End Date Taking? Authorizing Provider  ?sulfamethoxazole-trimethoprim (BACTRIM DS) 800-160 MG tablet Take 1 tablet by mouth 2 (two) times daily for 7 days. 02/19/22 02/26/22 Yes Eliezer Lofts, FNP  ?benzonatate (TESSALON) 100 MG capsule Take 1 capsule (100 mg total) by mouth every 8 (eight) hours. 01/10/21   Noe Gens, PA-C  ?fluconazole (DIFLUCAN) 150 MG tablet Take 1 tablet (150 mg total) by mouth daily. 03/28/20   Orma Render, NP  ?hydrOXYzine (ATARAX/VISTARIL) 25 MG tablet Take 25 mg by mouth 3 (three) times daily. ?Patient not taking: Reported on 01/17/2021 01/08/21   [provider]  ?levonorgestrel (MIRENA, 52 MG,) 20 MCG/24HR IUD Mirena 20 mcg/24 hours (6 yrs) 52 mg intrauterine device ? Take 1 device by intrauterine route.    [provider]  ?sertraline (ZOLOFT) 50 MG tablet Take 50 mg by mouth daily. ?Patient not taking: Reported on 01/17/2021 01/08/21   [provider]  ? ? ?Family History ?Family History  ?Problem Relation Age of Onset  ? Prostate cancer Other   ? Diabetes Father   ? Cancer Father   ? Healthy Mother   ? ? ?Social History ?Social History  ? ?Tobacco Use  ? Smoking status: Never  ? Smokeless tobacco: Never  ?Substance Use  Topics  ? Alcohol use: No  ?  Alcohol/week: 0.0 standard drinks  ? Drug use: No  ? ? ? ?Allergies   ?Patient has no known allergies. ? ? ?Review of Systems ?Review of Systems  ?Genitourinary:  Positive for dysuria and frequency.  ?All other systems reviewed and are negative. ? ? ?Physical Exam ?Triage Vital Signs ?ED Triage Vitals  ?Enc Vitals Group  ?   BP 02/19/22 1606 100/65  ?   Pulse Rate 02/19/22 1606 77  ?   Resp 02/19/22 1606 17  ?   Temp 02/19/22 1606 98.5 ?F (36.9 ?C)  ?   Temp Source 02/19/22 1606 Oral  ?   SpO2 02/19/22 1606 97 %  ?   Weight --   ?   Height --   ?   Head Circumference --   ?   Peak Flow --   ?   Pain Score 02/19/22 1607 0  ?   Pain Loc --   ?   Pain Edu? --   ?   Excl. in [place]? --   ? ?No data found. ? ?Updated Vital Signs ?BP 100/65 (BP Location: Right Arm)   Pulse 77   Temp 98.5 ?F (36.9 ?C) (Oral)   Resp 17   SpO2 97%  ? ?  ? ?Physical Exam ?Vitals and nursing note reviewed.  ?Constitutional:   ?  Appearance: Normal appearance. She is normal weight.  ?HENT:  ?   Head: Normocephalic and atraumatic.  ?   Mouth/Throat:  ?   Mouth: Mucous membranes are moist.  ?   Pharynx: Oropharynx is clear.  ?Eyes:  ?   Extraocular Movements: Extraocular movements intact.  ?   Conjunctiva/sclera: Conjunctivae normal.  ?   Pupils: Pupils are equal, round, and reactive to light.  ?Cardiovascular:  ?   Rate and Rhythm: Normal rate and regular rhythm.  ?   Pulses: Normal pulses.  ?   Heart sounds: Normal heart sounds.  ?Pulmonary:  ?   Effort: Pulmonary effort is normal.  ?   Breath sounds: Normal breath sounds. No wheezing, rhonchi or rales.  ?Abdominal:  ?   Tenderness: There is no right CVA tenderness or left CVA tenderness.  ?Musculoskeletal:  ?   Cervical back: Normal range of motion and neck supple.  ?Skin: ?   General: Skin is warm and dry.  ?Neurological:  ?   General: No focal deficit present.  ?   Mental Status: She is alert and oriented to person, place, and time.  ? ? ? ?UC Treatments /  Results  ?Labs ?(all labs ordered are listed, but only abnormal results are displayed) ?Labs Reviewed  ?POCT URINALYSIS DIP (MANUAL ENTRY) - Abnormal; Notable for the following components:  ?    Result Value  ? Spec Grav, UA >=1.030 (*)   ? Blood, UA small (*)   ? Leukocytes, UA Trace (*)   ? All other components within normal limits  ?URINE CULTURE  ? ? ?EKG ? ? ?Radiology ?No results found. ? ?Procedures ?Procedures (including critical care time) ? ?Medications Ordered in UC ?Medications - No data to display ? ?Initial Impression / Assessment and Plan / UC Course  ?I have reviewed the triage vital signs and the nursing notes. ? ?Pertinent labs & imaging results that were available during my care of the patient were reviewed by me and considered in my medical decision making (see chart for details). ? ?  ? ?MDM: 1.  Acute cystitis with hematuria-Rx'd Bactrim. Advised patient to take medication as directed with food to completion.  Encouraged patient to increase daily water intake while taking this medication.  Advised we will follow-up with her once urine culture results are received.  Patient discharged home, hemodynamically stable. ?Final Clinical Impressions(s) / UC Diagnoses  ? ?Final diagnoses:  ?Dysuria  ?Acute cystitis with hematuria  ? ? ? ?Discharge Instructions   ? ?  ?Advised patient to take medication as directed with food to completion.  Encouraged patient to increase daily water intake while taking this medication.  Advised we will follow-up with her once urine culture results are received. ? ? ? ? ?ED Prescriptions   ? ? Medication Sig Dispense Auth. Provider  ? sulfamethoxazole-trimethoprim (BACTRIM DS) 800-160 MG tablet Take 1 tablet by mouth 2 (two) times daily for 7 days. 14 tablet Eliezer Lofts, FNP  ? ?  ? ?PDMP not reviewed this encounter. ?  ?Eliezer Lofts, [place] ?02/19/22 1628 ? ?

## 2022-02-19 NOTE — ED Triage Notes (Signed)
Pt c/o dysuria and urinary frequency x 2 days.  

## 2022-02-21 LAB — URINE CULTURE
MICRO NUMBER:: 13100828
SPECIMEN QUALITY:: ADEQUATE

## 2022-04-14 ENCOUNTER — Encounter: Payer: Self-pay | Admitting: Emergency Medicine

## 2022-04-14 ENCOUNTER — Emergency Department (INDEPENDENT_AMBULATORY_CARE_PROVIDER_SITE_OTHER)
Admission: EM | Admit: 2022-04-14 | Discharge: 2022-04-14 | Disposition: A | Discharge status: Home / Self Care | Payer: BC Managed Care – PPO | Source: Home / Self Care

## 2022-04-14 ENCOUNTER — Emergency Department: Admit: 2022-04-14 | Payer: BC Managed Care – PPO | Source: Home / Self Care

## 2022-04-14 DIAGNOSIS — H9201 Otalgia, right ear: Secondary | ICD-10-CM | POA: Diagnosis not present

## 2022-04-14 DIAGNOSIS — H6981 Other specified disorders of Eustachian tube, right ear: Secondary | ICD-10-CM | POA: Diagnosis not present

## 2022-04-14 MED ORDER — BUDESONIDE 32 MCG/ACT NA SUSP: 2.0000 | Freq: Every day | NASAL | 0 refills | Status: DC

## 2022-04-14 NOTE — Discharge Instructions (Addendum)
Use the steroid nasal spray 2 times a day for the first couple of days then go down to 1 a day until you see improvement with lots of fluids ?Read information on eustachian tube dysfunction ? ?

## 2022-04-14 NOTE — ED Provider Notes (Signed)
?[facility] ? ? ? ?CSN: [id number] ?Arrival date & time: 04/14/22  1439 ? ? ?  ? ?History   ?Chief Complaint ?Chief Complaint  ?Patient presents with  ? Otalgia  ? ? ?HPI ?Victoria Pace is a 41 y.o. female.  ? ?HPI ? ?Minor cold symptoms during the week.  Now has had decreased hearing, pressure and popping, muffled hearing in the right ear for couple of days. ? ?History reviewed. No pertinent past medical history. ? ?Patient Active Problem List  ? Diagnosis Date Noted  ? IUD (intrauterine device) in place 07/24/2017  ? Nevus 03/16/2015  ? ANEMIA 03/04/2011  ? ? ?Past Surgical History:  ?Procedure Laterality Date  ? CESAREAN SECTION  05/24/2011  ? ? ?OB History   ?No obstetric history on file. ?  ? ? ? ?Home Medications   ? ?Prior to Admission medications   ?Medication Sig Start Date End Date Taking? Authorizing Provider  ?budesonide (RHINOCORT AQUA) 32 MCG/ACT nasal spray Place 2 sprays into both nostrils daily. 04/14/22  Yes Raylene Everts, MD  ?levonorgestrel (MIRENA, 52 MG,) 20 MCG/24HR IUD Mirena 20 mcg/24 hours (6 yrs) 52 mg intrauterine device ? Take 1 device by intrauterine route.   Yes [provider]  ?sertraline (ZOLOFT) 50 MG tablet Take 50 mg by mouth daily. 01/08/21  Yes [provider]  ? ? ?Family History ?Family History  ?Problem Relation Age of Onset  ? Prostate cancer Other   ? Diabetes Father   ? Cancer Father   ? Healthy Mother   ? ? ?Social History ?Social History  ? ?Tobacco Use  ? Smoking status: Never  ? Smokeless tobacco: Never  ?Substance Use Topics  ? Alcohol use: No  ?  Alcohol/week: 0.0 standard drinks  ? Drug use: No  ? ? ? ?Allergies   ?Patient has no known allergies. ? ? ?Review of Systems ?Review of Systems ? ?See HPI ?Physical Exam ?Triage Vital Signs ?ED Triage Vitals  ?Enc Vitals Group  ?   BP 04/14/22 1450 102/71  ?   Pulse Rate 04/14/22 1450 79  ?   Resp 04/14/22 1450 16  ?   Temp 04/14/22 1450 97.9 ?F (36.6 ?C)  ?   Temp Source 04/14/22 1450  Oral  ?   SpO2 04/14/22 1450 97 %  ?   Weight --   ?   Height --   ?   Head Circumference --   ?   Peak Flow --   ?   Pain Score 04/14/22 1448 5  ?   Pain Loc --   ?   Pain Edu? --   ?   Excl. in [place]? --   ? ?No data found. ? ?Updated Vital Signs ?BP 102/71 (BP Location: Right Arm)   Pulse 79   Temp 97.9 ?F (36.6 ?C) (Oral)   Resp 16   SpO2 97%  ?   ? ?Physical Exam ?Constitutional:   ?   General: She is not in acute distress. ?   Appearance: She is well-developed.  ?HENT:  ?   Head: Normocephalic and atraumatic.  ?   Right Ear: Tympanic membrane and ear canal normal.  ?   Left Ear: Tympanic membrane and ear canal normal.  ?   Ears:  ?   Comments: Left TM is mildly injection.  Right TM has an altered light reflex. ?   Nose: No congestion.  ?   Mouth/Throat:  ?   Pharynx: No posterior  oropharyngeal erythema.  ?Eyes:  ?   Conjunctiva/sclera: Conjunctivae normal.  ?   Pupils: Pupils are equal, round, and reactive to light.  ?Cardiovascular:  ?   Rate and Rhythm: Normal rate.  ?Pulmonary:  ?   Effort: Pulmonary effort is normal. No respiratory distress.  ?Abdominal:  ?   General: There is no distension.  ?   Palpations: Abdomen is soft.  ?Musculoskeletal:     ?   General: Normal range of motion.  ?   Cervical back: Normal range of motion.  ?Lymphadenopathy:  ?   Cervical: No cervical adenopathy.  ?Skin: ?   General: Skin is warm and dry.  ?Neurological:  ?   Mental Status: She is alert.  ?Psychiatric:     ?   Mood and Affect: Mood normal.     ?   Behavior: Behavior normal.  ? ? ? ?UC Treatments / Results  ?Labs ?(all labs ordered are listed, but only abnormal results are displayed) ?Labs Reviewed - No data to display ? ?EKG ? ? ?Radiology ?No results found. ? ?Procedures ?Procedures (including critical care time) ? ?Medications Ordered in UC ?Medications - No data to display ? ?Initial Impression / Assessment and Plan / UC Course  ?I have reviewed the triage vital signs and the nursing notes. ? ?Pertinent labs &  imaging results that were available during my care of the patient were reviewed by me and considered in my medical decision making (see chart for details). ? ?  ? ?Final Clinical Impressions(s) / UC Diagnoses  ? ?Final diagnoses:  ?Right ear pain  ?Eustachian tube dysfunction, right  ? ? ? ?Discharge Instructions   ? ?  ?Use the steroid nasal spray 2 times a day for the first couple of days then go down to 1 a day until you see improvement with lots of fluids ?Read information on eustachian tube dysfunction ? ? ? ?ED Prescriptions   ? ? Medication Sig Dispense Auth. Provider  ? budesonide (RHINOCORT AQUA) 32 MCG/ACT nasal spray Place 2 sprays into both nostrils daily. 5 mL Raylene Everts, MD  ? ?  ? ?PDMP not reviewed this encounter. ?  ?Raylene Everts, MD ?04/14/22 1537 ? ?

## 2022-04-14 NOTE — ED Triage Notes (Signed)
Patient presents to Urgent Care with complaints of right ear pain, decreased hearing since 2-3 days ago. Patient reports sound is muffled. She did use a cue tip to clean out the right ear. Denies drainage of the ear. Denies any fever or chills. ?

## 2022-10-15 ENCOUNTER — Ambulatory Visit (INDEPENDENT_AMBULATORY_CARE_PROVIDER_SITE_OTHER): Payer: BC Managed Care – PPO | Admitting: Medical-Surgical

## 2022-10-15 ENCOUNTER — Encounter: Payer: Self-pay | Admitting: Medical-Surgical

## 2022-10-15 VITALS — BP 106/71 | HR 71 | Resp 20 | Ht 68.0 in | Wt 201.2 lb

## 2022-10-15 DIAGNOSIS — R21 Rash and other nonspecific skin eruption: Secondary | ICD-10-CM | POA: Diagnosis not present

## 2022-10-15 DIAGNOSIS — H109 Unspecified conjunctivitis: Secondary | ICD-10-CM

## 2022-10-15 DIAGNOSIS — R635 Abnormal weight gain: Secondary | ICD-10-CM

## 2022-10-15 DIAGNOSIS — Z23 Encounter for immunization: Secondary | ICD-10-CM

## 2022-10-15 DIAGNOSIS — O26849 Uterine size-date discrepancy, unspecified trimester: Secondary | ICD-10-CM | POA: Insufficient documentation

## 2022-10-15 DIAGNOSIS — B9689 Other specified bacterial agents as the cause of diseases classified elsewhere: Secondary | ICD-10-CM

## 2022-10-15 MED ORDER — POLYMYXIN B-TRIMETHOPRIM 10000-0.1 UNIT/ML-% OP SOLN: 1.0000 [drp] | Freq: Three times a day (TID) | OPHTHALMIC | 0 refills | Status: AC

## 2022-10-15 MED ORDER — HYDROCORTISONE 0.5 % EX CREA: 1.0000 | TOPICAL_CREAM | Freq: Two times a day (BID) | CUTANEOUS | 0 refills | Status: AC

## 2022-10-15 NOTE — Progress Notes (Signed)
Established Patient Office Visit  Subjective   Patient ID: Victoria Pace, female   DOB: Aug 17, 1981 Age: 41 y.o. MRN: [id number]   Chief Complaint  Patient presents with   Rash    Face and eyes    HPI Pleasant 41 year old female presenting today for the following:  Rash: Notes that approximately 2 days ago, she and her family were out in the woods taking full pictures.  The next morning she awoke with redness and puffiness to her upper and lower eyelids that quickly spread to her rash along her cheeks.  She took Benadryl to help with the itching and to help rest at night.  The next day, she and her family went to great [facility] and she was exposed to chlorine in the pool.  Reports that this seemed to make the area on her face that was affected dry out.  It is no longer as itchy as it was however it is still red and uncomfortable.  No new cosmetics, fragrances, medications, foods, materials, or detergents.  They do have a family pet that does not have free reign exposure to outside and possible allergens.  This morning, she noted that her eyes were draining bilaterally and she had crusting around her eyelids but did not note what color the crust was.  Weight concerns: Reports that she is having issues with weight gain and wonders if there is anything out there that can help her.  She is now to a point where she feels bad about herself because of her weight.  She is exercising regularly and reports that she does not eat terribly.  Not logging foods or weighing portions at this point.  Not currently taking any medications to help with weight loss.  Wonders if some of this may be related to age and hormones.   Objective:    Vitals:   10/15/22 1603  BP: 106/71  Pulse: 71  Resp: 20  Height: '5\' 8"'$  (1.727 m)  Weight: 201 lb 3.2 oz (91.3 kg)  SpO2: 97%  BMI (Calculated): 30.6   Physical Exam Vitals and nursing note reviewed.  Constitutional:      General: She is not in acute  distress.    Appearance: Normal appearance. She is not ill-appearing.  HENT:     Head: Normocephalic and atraumatic.  Cardiovascular:     Rate and Rhythm: Normal rate and regular rhythm.  Pulmonary:     Effort: Pulmonary effort is normal. No respiratory distress.  Skin:    General: Skin is warm and dry.     Findings: Rash (Bilateral cheeks) present.  Neurological:     Mental Status: She is alert and oriented to person, place, and time.  Psychiatric:        Mood and Affect: Mood normal.        Behavior: Behavior normal.        Thought Content: Thought content normal.        Judgment: Judgment normal.   No results found for this or any previous visit (from the past 24 hour(s)).     The ASCVD Risk score (Arnett DK, et al., 2019) failed to calculate for the following reasons:   Cannot find a previous HDL lab   Cannot find a previous total cholesterol lab   Assessment & Plan:   1. Need for influenza vaccination Flu vaccine given in office today. - Flu Vaccine QUAD 6+ mos PF IM (Fluarix Quad PF)  2. Facial rash Unclear etiology.  Consider  irritant dermatitis versus allergy.  Okay to continue using Benadryl if desired.  Low potency hydrocortisone cream twice daily for up to 7 days to the affected area.  Advised using baby shampoo to wash over affected areas of the face and eyelids, rinse well and pat dry. - hydrocortisone cream 0.5 %; Apply 1 Application topically 2 (two) times daily for 7 days.  Dispense: 15 g; Refill: 0  3. Bacterial conjunctivitis of both eyes With bilateral eye drainage, treating empirically with Polytrim 1 drop bilaterally 3 times daily for the next 7 days. - trimethoprim-polymyxin b (POLYTRIM) ophthalmic solution; Place 1 drop into both eyes in the morning, at noon, and at bedtime for 7 days.  Dispense: 10 mL; Refill: 0  4. Weight gain Discussed various options for weight management.  Reviewed several medications that may be beneficial but advised that these  are usually a temporary situation and patients have to be very careful when they stop the medications to not gain the weight back.  Discussed recommendations for dietary modification, logging foods, portion control, and regular intentional exercise.  Referring to medical weight management but in the meantime would like for her to start tracking her intake without making changes for the next couple of weeks to help direct her weight loss efforts. - Amb Ref to Medical Weight Management  Return if symptoms worsen or fail to improve.  ___________________________________________ Clearnce Sorrel, DNP, APRN, FNP-BC Primary Care and [place]

## 2022-10-16 LAB — HM PAP SMEAR: HPV, high-risk: NEGATIVE

## 2022-10-18 ENCOUNTER — Encounter: Payer: Self-pay | Admitting: Medical-Surgical

## 2023-01-16 ENCOUNTER — Encounter: Payer: Self-pay | Admitting: Medical-Surgical

## 2023-01-16 NOTE — Telephone Encounter (Signed)
Please contact patient. I just saw her in October and will be glad to accept her as mine. She just never officially transferred care over. Can we get her on my schedule fairly soon for follow up on thyroid labs and transfer of care?  ___________________________________________ Clearnce Sorrel, DNP, APRN, FNP-BC Primary Care and [place]

## 2023-01-27 ENCOUNTER — Ambulatory Visit: Payer: BC Managed Care – PPO | Admitting: Family Medicine

## 2023-02-06 ENCOUNTER — Ambulatory Visit (INDEPENDENT_AMBULATORY_CARE_PROVIDER_SITE_OTHER): Payer: BC Managed Care – PPO | Admitting: Medical-Surgical

## 2023-02-06 ENCOUNTER — Encounter: Payer: Self-pay | Admitting: Medical-Surgical

## 2023-02-06 VITALS — BP 104/72 | HR 82 | Resp 18 | Ht 68.0 in | Wt 169.1 lb

## 2023-02-06 DIAGNOSIS — R946 Abnormal results of thyroid function studies: Secondary | ICD-10-CM | POA: Diagnosis not present

## 2023-02-06 DIAGNOSIS — Z7689 Persons encountering health services in other specified circumstances: Secondary | ICD-10-CM | POA: Diagnosis not present

## 2023-02-06 DIAGNOSIS — F3281 Premenstrual dysphoric disorder: Secondary | ICD-10-CM | POA: Diagnosis not present

## 2023-02-06 MED ORDER — CITALOPRAM HYDROBROMIDE 10 MG PO TABS
10.0000 mg | ORAL_TABLET | Freq: Every day | ORAL | 3 refills | Status: DC
Start: 2023-02-06 — End: 2023-04-17

## 2023-02-06 NOTE — Progress Notes (Signed)
Established Patient Office Visit  Subjective   Patient ID: Victoria Pace, female   DOB: 17-Feb-1981 Age: 42 y.o. MRN: [id number]   Chief Complaint  Patient presents with   Follow-up   Thyroid Problem   HPI Pleasant 42 year old female presenting today to transfer care to a new PCP and for the following:  She has been going to [facility] and they have been working with her on weight loss.  She has done well so far and is happy with her progress.  Recently, they checked labs and she was informed that her TSH level was elevated.  She would like to get this investigated further to see if there is a need for treatment.  Denies any concerning symptoms such as palpitations, chest pain, shortness of breath, worsened anxiety, hair loss, skin changes, and GI upset.  Mood: Previously treated with sertraline 50 mg daily but was able to come off this medication after some aggressive counseling.  She feels that her mood is very stable for the most part and she does well during part of each month.  Unfortunately she notes a recurrence of her anxiety and mood swings shortly before her menses each month.  She is interested in options that will help her with this.  Admits that she would prefer to avoid a daily medication but something to use at the onset of symptoms would be helpful.  Denies SI/HI.   Objective:    Vitals:   02/06/23 1437 02/06/23 1438  BP: 104/72 104/72  Pulse: 82   Resp: 18   Height: 5' 8"$  (1.727 m)   Weight: 169 lb 1.9 oz (76.7 kg)   SpO2: 98% 98%  BMI (Calculated): 25.72    Physical Exam Vitals reviewed.  Constitutional:      General: She is not in acute distress.    Appearance: Normal appearance. She is not ill-appearing.  HENT:     Head: Normocephalic and atraumatic.  Cardiovascular:     Rate and Rhythm: Normal rate and regular rhythm.     Pulses: Normal pulses.     Heart sounds: Normal heart sounds.  Pulmonary:     Effort: Pulmonary effort is normal. No  respiratory distress.     Breath sounds: Normal breath sounds. No wheezing, rhonchi or rales.  Skin:    General: Skin is warm and dry.  Neurological:     Mental Status: She is alert and oriented to person, place, and time.  Psychiatric:        Mood and Affect: Mood normal.        Behavior: Behavior normal.        Thought Content: Thought content normal.        Judgment: Judgment normal.   No results found for this or any previous visit (from the past 24 hour(s)).     The 10-year ASCVD risk score (Arnett DK, et al., 2019) is: 0.9%   Values used to calculate the score:     Age: 65 years     Sex: Female     Is Non-Hispanic African American: No     Diabetic: No     Tobacco smoker: No     Systolic Blood Pressure: 123456 mmHg     Is BP treated: No     HDL Cholesterol: 36 mg/dL     Total Cholesterol: 189 mg/dL   Assessment & Plan:   1. Abnormal thyroid function test Reviewed available lab results.  Her TSH was at 5.490.  This was taken approximately 3 weeks ago.  We will go ahead and recheck this but adding the full thyroid panel, free T4, and TPO's for further investigation. - T4, free - Thyroid peroxidase antibody - Thyroid Panel With TSH  2. PMDD (premenstrual dysphoric disorder) Reviewed various options.  She had significant weight gain with sertraline and wants to avoid a daily medication if possible.  Reviewed indications for using Celexa for PMDD.  She would like to try symptomatic dosing of Celexa with 10 mg daily starting when she notices symptom onset until her symptoms began to resolve as her menstrual cycle progresses.  Advised that we may have to increase her dose at some point but would like her to try this for 1 to 2 months and then let me know. - citalopram (CELEXA) 10 MG tablet; Take 1 tablet (10 mg total) by mouth daily.  Dispense: 30 tablet; Refill: 3  Return for annual physical exam at your convenience.  ___________________________________________ Clearnce Sorrel,  DNP, APRN, FNP-BC Primary Care and [place]

## 2023-02-07 ENCOUNTER — Other Ambulatory Visit: Payer: Self-pay

## 2023-02-07 DIAGNOSIS — R946 Abnormal results of thyroid function studies: Secondary | ICD-10-CM

## 2023-02-07 LAB — THYROID PANEL WITH TSH
Free Thyroxine Index: 1.7 (ref 1.4–3.8)
T3 Uptake: 25 % (ref 22–35)
T4, Total: 6.9 ug/dL (ref 5.1–11.9)
TSH: 4.4 mIU/L

## 2023-02-07 LAB — THYROID PEROXIDASE ANTIBODY: Thyroperoxidase Ab SerPl-aCnc: 4 IU/mL (ref <9)

## 2023-02-07 LAB — T4, FREE: Free T4: 0.9 ng/dL (ref 0.8–1.8)

## 2023-02-10 ENCOUNTER — Other Ambulatory Visit: Payer: Self-pay

## 2023-02-10 DIAGNOSIS — R946 Abnormal results of thyroid function studies: Secondary | ICD-10-CM

## 2023-04-14 ENCOUNTER — Ambulatory Visit: Payer: BC Managed Care – PPO | Admitting: Medical-Surgical

## 2023-04-17 ENCOUNTER — Encounter: Payer: Self-pay | Admitting: Medical-Surgical

## 2023-04-17 ENCOUNTER — Ambulatory Visit (INDEPENDENT_AMBULATORY_CARE_PROVIDER_SITE_OTHER): Payer: BC Managed Care – PPO | Admitting: Medical-Surgical

## 2023-04-17 VITALS — BP 103/66 | HR 97 | Resp 20 | Ht 68.0 in | Wt 162.9 lb

## 2023-04-17 DIAGNOSIS — R946 Abnormal results of thyroid function studies: Secondary | ICD-10-CM

## 2023-04-17 DIAGNOSIS — F3281 Premenstrual dysphoric disorder: Secondary | ICD-10-CM

## 2023-04-17 MED ORDER — CITALOPRAM HYDROBROMIDE 10 MG PO TABS: 10.0000 mg | ORAL_TABLET | Freq: Every day | ORAL | 3 refills | Status: DC

## 2023-04-17 NOTE — Progress Notes (Signed)
        Established patient visit  History, exam, impression, and plan:  1. Abnormal thyroid function test Recent thyroid function testing at [facility] reported an elevated TSH. This was rechecked in February showed normal TSH yet on the upper limit of normal with a borderline low free T4. At the time, she was asymptomatic. Recommended recheck in 6-8 weeks. Today, continues to deny concerning symptoms. Recheck TSH and free T4 today.   2. PMDD (premenstrual dysphoric disorder) Previous discussion regarding mood swings surrounding her menstrual cycle led to prescribing Celexa 10mg  daily. Today she reports that she never got the medication and the pharmacy stated that they did not have a prescription on file for her. Still interested in medication to help with symptoms. Discussed options for management including prn medications vs maintenance options. Resending Celexa 10mg  to the pharmacy. Plan to start this and follow up via MyChart with me in about 4-6 weeks to evaluate tolerance and response.  - citalopram (CELEXA) 10 MG tablet; Take 1 tablet (10 mg total) by mouth daily.  Dispense: 90 tablet; Refill: 3   Procedures performed this visit: None.  Return if symptoms worsen or fail to improve.  __________________________________ Thayer Ohm, DNP, APRN, FNP-BC Primary Care and Sports Medicine [facility]

## 2023-04-18 LAB — TSH: TSH: 2.52 mIU/L

## 2023-04-18 LAB — T4, FREE: Free T4: 0.9 ng/dL (ref 0.8–1.8)

## 2023-05-22 ENCOUNTER — Encounter: Payer: Self-pay | Admitting: Medical-Surgical

## 2023-06-27 ENCOUNTER — Ambulatory Visit
Admission: EM | Admit: 2023-06-27 | Discharge: 2023-06-27 | Disposition: A | Discharge status: Home / Self Care | Payer: BC Managed Care – PPO | Attending: Family Medicine | Admitting: Family Medicine

## 2023-06-27 DIAGNOSIS — W57XXXA Bitten or stung by nonvenomous insect and other nonvenomous arthropods, initial encounter: Secondary | ICD-10-CM | POA: Diagnosis not present

## 2023-06-27 DIAGNOSIS — L03114 Cellulitis of left upper limb: Secondary | ICD-10-CM

## 2023-06-27 MED ORDER — METHYLPREDNISOLONE ACETATE 80 MG/ML IJ SUSP
80.0000 mg | Freq: Once | INTRAMUSCULAR | Status: AC
  Administered 2023-06-27: 80 mg via INTRAMUSCULAR

## 2023-06-27 MED ORDER — DOXYCYCLINE HYCLATE 100 MG PO CAPS: 100.0000 mg | ORAL_CAPSULE | Freq: Two times a day (BID) | ORAL | 0 refills | Status: AC

## 2023-06-27 NOTE — ED Triage Notes (Signed)
Pt presents to uc with co of left elbow insect bite yesterday. Pt reports she drew a circle around the bite at 1200 today because it continued to  swell has been using anti itch creams

## 2023-06-27 NOTE — ED Provider Notes (Signed)
[facility]    CSN: [id number] Arrival date & time: 06/27/23  1602      History   Chief Complaint Chief Complaint  Patient presents with   Insect Bite    HPI Victoria Pace is a 42 y.o. female.   HPI 42 year old female presents with insect bite of left elbow that occurred yesterday.  Patient reports swelling has increased since initial insect bite.  PMH significant for anemia and IUD in place.  History reviewed. No pertinent past medical history.  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      Home Medications    Prior to Admission medications   Medication Sig Start Date End Date Taking? Authorizing Provider  citalopram (CELEXA) 10 MG tablet Take 1 tablet (10 mg total) by mouth daily. 04/17/23   Christen Butter, NP  doxycycline (VIBRAMYCIN) 100 MG capsule Take 1 capsule (100 mg total) by mouth 2 (two) times daily for 10 days. 06/27/23 07/07/23 Yes Trevor Iha, FNP  levonorgestrel (MIRENA, 52 MG,) 20 MCG/24HR IUD Mirena 20 mcg/24 hours (6 yrs) 52 mg intrauterine device  Take 1 device by intrauterine route.    [provider]    Family History Family History  Problem Relation Age of Onset   Prostate cancer Other    Diabetes Father    Cancer Father    Healthy Mother     Social History Social History   Tobacco Use   Smoking status: Never   Smokeless tobacco: Never  Substance Use Topics   Alcohol use: No    Alcohol/week: 0.0 standard drinks of alcohol   Drug use: No     Allergies   Patient has no known allergies.   Review of Systems Review of Systems  Musculoskeletal:        Left elbow redness and  swelling  Skin:  Positive for rash.     Physical Exam Triage Vital Signs ED Triage Vitals  Encounter Vitals Group     BP      Systolic BP Percentile      Diastolic BP Percentile      Pulse      Resp      Temp      Temp src      SpO2      Weight      Height      Head Circumference      Peak Flow      Pain Score      Pain Loc      Pain Education      Exclude from Growth Chart    No data found.  Updated Vital Signs BP 98/70   Pulse (!) 106   Temp 97.9 F (36.6 C)   Resp 16   LMP 06/04/2023   SpO2 98%   Physical Exam Vitals and nursing note reviewed.  Constitutional:      Appearance: Normal appearance. She is obese.  HENT:     Head: Normocephalic and atraumatic.     Mouth/Throat:     Mouth: Mucous membranes are moist.     Pharynx: Oropharynx is clear.  Eyes:  Extraocular Movements: Extraocular movements intact.     Conjunctiva/sclera: Conjunctivae normal.     Pupils: Pupils are equal, round, and reactive to light.  Cardiovascular:     Rate and Rhythm: Normal rate and regular rhythm.     Pulses: Normal pulses.     Heart sounds: Normal heart sounds.  Pulmonary:     Effort: Pulmonary effort is normal.     Breath sounds: Normal breath sounds. No wheezing, rhonchi or rales.  Musculoskeletal:        General: Normal range of motion.     Cervical back: Normal range of motion and neck supple.     Comments: Left elbow (over inferior olecranon): Erythematous, mildly indurated, with moderate soft tissue swelling noted-please see image below  Skin:    General: Skin is warm and dry.  Neurological:     General: No focal deficit present.     Mental Status: She is alert and oriented to person, place, and time. Mental status is at baseline.  Psychiatric:        Mood and Affect: Mood normal.        Behavior: Behavior normal.        Thought Content: Thought content normal.      UC Treatments / Results  Labs (all labs ordered are listed, but only abnormal results  are displayed) Labs Reviewed - No data to display  EKG   Radiology No results found.  Procedures Procedures (including critical care time)  Medications Ordered in UC Medications  methylPREDNISolone acetate (DEPO-MEDROL) injection 80 mg (80 mg Intramuscular Given 06/27/23 1647)    Initial Impression / Assessment and Plan / UC Course  I have reviewed the triage vital signs and the nursing notes.  Pertinent labs & imaging results that were available during my care of the patient were reviewed by me and considered in my medical decision making (see chart for details).     MDM: 1.  Cellulitis of left elbow-Rx'd Doxycycline 100 mg capsule twice daily x 10 days; 2.  Bug bite with infection, initial encounter-IM Depo-Medrol 80 mg given once in clinic and prior to discharge Rx'd doxycycline 100 mg capsule twice daily x 10 days.  Advised RICE of infected left elbow 30 minutes 3 times daily for the next 3 days.  Instructed patient to take medication as directed with food to completion.  Encouraged increase daily water intake to 64 ounces per day while taking this medication.  Advised patient may RICE left elbow for 30 minutes 3 times daily to reduce left elbow swelling.  Advised if symptoms worsen and/or unresolved please follow-up with PCP or here for further evaluation.  Patient discharged home, hemodynamically stable.  Final Clinical Impressions(s) / UC Diagnoses   Final diagnoses:  Cellulitis of left elbow  Bug bite with infection, initial encounter     Discharge Instructions      Instructed patient to take medication as directed with food to completion.  Encouraged increase daily water intake to 64 ounces per day while taking this medication.  Advised patient may RICE left elbow for 30 minutes 3 times daily to reduce left elbow swelling.  Advised if symptoms worsen and/or unresolved please follow-up with PCP or here for further evaluation.     ED Prescriptions     Medication Sig  Dispense Auth. Provider   doxycycline (VIBRAMYCIN) 100 MG capsule Take 1 capsule (100 mg total) by mouth 2 (two) times daily for 10 days. 20 capsule Trevor Iha, FNP  PDMP not reviewed this encounter.   Trevor Iha, FNP 06/27/23 1708

## 2023-06-27 NOTE — Discharge Instructions (Addendum)
Instructed patient to take medication as directed with food to completion.  Encouraged increase daily water intake to 64 ounces per day while taking this medication.  Advised patient may RICE left elbow for 30 minutes 3 times daily to reduce left elbow swelling.  Advised if symptoms worsen and/or unresolved please follow-up with PCP or here for further evaluation.

## 2023-10-21 ENCOUNTER — Encounter: Payer: BC Managed Care – PPO | Admitting: Medical-Surgical

## 2023-10-21 LAB — HM MAMMOGRAPHY

## 2023-10-26 ENCOUNTER — Ambulatory Visit
Admission: EM | Admit: 2023-10-26 | Discharge: 2023-10-26 | Disposition: A | Discharge status: Home / Self Care | Payer: BC Managed Care – PPO | Attending: Family Medicine | Admitting: Family Medicine

## 2023-10-26 ENCOUNTER — Other Ambulatory Visit: Payer: Self-pay

## 2023-10-26 DIAGNOSIS — T7840XA Allergy, unspecified, initial encounter: Secondary | ICD-10-CM | POA: Diagnosis not present

## 2023-10-26 DIAGNOSIS — W57XXXA Bitten or stung by nonvenomous insect and other nonvenomous arthropods, initial encounter: Secondary | ICD-10-CM

## 2023-10-26 DIAGNOSIS — S80861A Insect bite (nonvenomous), right lower leg, initial encounter: Secondary | ICD-10-CM

## 2023-10-26 MED ORDER — BETAMETHASONE DIPROPIONATE 0.05 % EX OINT: TOPICAL_OINTMENT | Freq: Two times a day (BID) | CUTANEOUS | 0 refills | Status: AC

## 2023-10-26 NOTE — ED Provider Notes (Signed)
[facility]    CSN: [id number] Arrival date & time: 10/26/23  1312      History   Chief Complaint Chief Complaint  Patient presents with   Insect Bite    HPI Victoria Pace is a 42 y.o. female.   HPI Patient has an insect bite on her right lower leg.  It slowly is getting larger.  It does not hurt.  It itches moderately.  It is firm, red, and hot.  No known allergies to other insects.  No systemic symptoms, malaise, shortness of breath  History reviewed. No pertinent past medical history.  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      Home Medications    Prior to Admission medications   Medication Sig Start Date End Date Taking? Authorizing Provider  betamethasone dipropionate (DIPROLENE) 0.05 % ointment Apply topically 2 (two) times daily. 10/26/23  Yes Eustace Moore, MD    Family History Family History  Problem Relation Age of Onset   Prostate cancer Other    Diabetes Father    Cancer Father    Healthy Mother     Social History Social History   Tobacco Use   Smoking status: Never   Smokeless tobacco: Never  Substance Use Topics   Alcohol use: No    Alcohol/week: 0.0 standard drinks of alcohol   Drug use: No     Allergies   Patient has no known allergies.   Review of Systems Review of Systems   Physical Exam Triage Vital Signs ED Triage Vitals  Encounter Vitals Group     BP 10/26/23 1330 90/61     Systolic BP Percentile --      Diastolic BP Percentile --      Pulse Rate 10/26/23 1330 70     Resp 10/26/23 1330 16     Temp 10/26/23 1330 98.4 F (36.9 C)     Temp Source 10/26/23 1330  Oral     SpO2 10/26/23 1330 99 %     Weight --      Height --      Head Circumference --      Peak Flow --      Pain Score 10/26/23 1334 0     Pain Loc --      Pain Education --      Exclude from Growth Chart --    No data found.  Updated Vital Signs BP 90/61   Pulse 70   Temp 98.4 F (36.9 C) (Oral)   Resp 16   SpO2 99%       Physical Exam Constitutional:      General: She is not in acute distress.    Appearance: She is well-developed and normal weight.  HENT:     Head: Normocephalic and atraumatic.  Eyes:     Conjunctiva/sclera: Conjunctivae normal.     Pupils: Pupils are equal, round, and reactive to light.  Cardiovascular:     Rate and Rhythm: Normal rate.  Pulmonary:     Effort: Pulmonary effort is normal. No respiratory distress.  Abdominal:     General: There is no distension.  Palpations: Abdomen is soft.  Musculoskeletal:        General: Normal range of motion.     Cervical back: Normal range of motion.  Skin:    General: Skin is warm and dry.     Findings: Lesion present.     Comments: On the medial aspect of the right calf, mid lower leg, there is a skin lesion/insect bite.  The central portion measures 4 cm across and is a cluster of vesicles, some ruptured.  It is raised.  Indurated.  Nontender.  Surrounding this there is an additional 2 cm to 3 cm of erythema of the skin that is also slightly indurated.  Neurological:     Mental Status: She is alert.      UC Treatments / Results  Labs (all labs ordered are listed, but only abnormal results are displayed) Labs Reviewed - No data to display  EKG   Radiology No results found.  Procedures Procedures (including critical care time)  Medications Ordered in UC Medications - No data to display  Initial Impression / Assessment and Plan / UC Course  I have reviewed the triage vital signs and the nursing notes.  Pertinent labs & imaging results that were available during my care of the  patient were reviewed by me and considered in my medical decision making (see chart for details).     Final Clinical Impressions(s) / UC Diagnoses   Final diagnoses:  Allergic reaction, initial encounter  Insect bite of right lower leg, initial encounter     Discharge Instructions      Consider an antihistamine like Claritin or Zyrtec to help with the allergic reaction Apply Diprolene twice a day for no more than 2 weeks Return as needed   ED Prescriptions     Medication Sig Dispense Auth. Provider   betamethasone dipropionate (DIPROLENE) 0.05 % ointment Apply topically 2 (two) times daily. 30 g Eustace Moore, MD      PDMP not reviewed this encounter.   Eustace Moore, MD 10/26/23 [phone]

## 2023-10-26 NOTE — ED Triage Notes (Signed)
Bit by insect on right lower leg, area is red and hot

## 2023-10-26 NOTE — Discharge Instructions (Addendum)
Consider an antihistamine like Claritin or Zyrtec to help with the allergic reaction Apply Diprolene twice a day for no more than 2 weeks Return as needed

## 2023-11-04 ENCOUNTER — Encounter (INDEPENDENT_AMBULATORY_CARE_PROVIDER_SITE_OTHER): Payer: Self-pay | Admitting: Medical-Surgical

## 2023-11-04 DIAGNOSIS — L03115 Cellulitis of right lower limb: Secondary | ICD-10-CM | POA: Diagnosis not present

## 2023-11-04 MED ORDER — FLUCONAZOLE 150 MG PO TABS: 150.0000 mg | ORAL_TABLET | Freq: Once | ORAL | 0 refills | Status: AC

## 2023-11-04 MED ORDER — DOXYCYCLINE HYCLATE 100 MG PO TABS: 100.0000 mg | ORAL_TABLET | Freq: Two times a day (BID) | ORAL | 0 refills | Status: DC

## 2023-11-04 NOTE — Telephone Encounter (Signed)
Please see the MyChart message reply(ies) for my assessment and plan.    This patient gave consent for this Medical Advice Message and is aware that it may result in a bill to their insurance company, as well as the possibility of receiving a bill for a co-payment or deductible. They are an established patient, but are not seeking medical advice exclusively about a problem treated during an in person or video visit in the last seven days. I did not recommend an in person or video visit within seven days of my reply.    I spent a total of 8 minutes cumulative time within 7 days through MyChart messaging.  Evelina Lore, NP   

## 2023-11-18 NOTE — Telephone Encounter (Signed)
Patient scheduled.

## 2023-11-20 ENCOUNTER — Ambulatory Visit (INDEPENDENT_AMBULATORY_CARE_PROVIDER_SITE_OTHER): Payer: BC Managed Care – PPO | Admitting: Medical-Surgical

## 2023-11-20 ENCOUNTER — Encounter: Payer: Self-pay | Admitting: Medical-Surgical

## 2023-11-20 VITALS — BP 93/62 | HR 72 | Resp 20 | Ht 68.0 in | Wt 163.4 lb

## 2023-11-20 DIAGNOSIS — L03115 Cellulitis of right lower limb: Secondary | ICD-10-CM

## 2023-11-20 MED ORDER — DOXYCYCLINE HYCLATE 100 MG PO TABS: 100.0000 mg | ORAL_TABLET | Freq: Two times a day (BID) | ORAL | 0 refills | Status: DC

## 2023-11-20 NOTE — Progress Notes (Signed)
        Established patient visit  History, exam, impression, and plan:  1. Cellulitis of right lower extremity Pleasant 42 year old female presenting today for reevaluation of cellulitis of the right leg where she was bitten by what is suspected to be a spider.  She completed topical steroid therapy that was prescribed at urgent care without benefit.  She was started on doxycycline twice daily for 7 days which she completed last week.  Redness, swelling, and tenderness has greatly improved however the lesion is still sizable with erythema.  No longer tender and has had no drainage.  No fever, chills, myalgias.  On evaluation, the area is normal temperature, erythematous, nonfluctuant.  Mildly raised with notable punctures in the center consistent with a bug bite.  Some resolution with doxycycline but not fully cleared.  Adding additional 5 days of doxycycline twice daily.  Recommend using the steroid cream that was prescribed twice daily to the site for the next 7 to 14 days as needed.  She has an appointment to follow-up in 2 weeks so we will plan to reevaluate at that time.  Procedures performed this visit: None.  Return if symptoms worsen or fail to improve.  __________________________________ Thayer Ohm, DNP, APRN, FNP-BC Primary Care and Sports Medicine [facility] ([place]) [place]

## 2023-11-23 ENCOUNTER — Encounter: Payer: Self-pay | Admitting: Medical-Surgical

## 2023-11-25 ENCOUNTER — Ambulatory Visit: Payer: BC Managed Care – PPO | Admitting: Medical-Surgical

## 2023-12-01 ENCOUNTER — Encounter: Payer: Self-pay | Admitting: Medical-Surgical

## 2023-12-01 ENCOUNTER — Ambulatory Visit (INDEPENDENT_AMBULATORY_CARE_PROVIDER_SITE_OTHER): Payer: BC Managed Care – PPO | Admitting: Medical-Surgical

## 2023-12-01 VITALS — BP 99/68 | HR 67 | Resp 20 | Ht 68.0 in | Wt 163.4 lb

## 2023-12-01 DIAGNOSIS — Z1231 Encounter for screening mammogram for malignant neoplasm of breast: Secondary | ICD-10-CM | POA: Diagnosis not present

## 2023-12-01 DIAGNOSIS — Z23 Encounter for immunization: Secondary | ICD-10-CM | POA: Diagnosis not present

## 2023-12-01 DIAGNOSIS — Z124 Encounter for screening for malignant neoplasm of cervix: Secondary | ICD-10-CM | POA: Diagnosis not present

## 2023-12-01 DIAGNOSIS — Z131 Encounter for screening for diabetes mellitus: Secondary | ICD-10-CM

## 2023-12-01 DIAGNOSIS — Z0000 Encounter for general adult medical examination without abnormal findings: Secondary | ICD-10-CM | POA: Diagnosis not present

## 2023-12-01 DIAGNOSIS — R946 Abnormal results of thyroid function studies: Secondary | ICD-10-CM

## 2023-12-01 NOTE — Patient Instructions (Addendum)
Trintellix Amitriptyline Nortriptyline (less sedating) Viibryd  Preventive Care 52-44 Years Old, Female Preventive care refers to lifestyle choices and visits with your health care provider that can promote health and wellness. Preventive care visits are also called wellness exams. What can I expect for my preventive care visit? Counseling Your health care provider may ask you questions about your: Medical history, including: Past medical problems. Family medical history. Pregnancy history. Current health, including: Menstrual cycle. Method of birth control. Emotional well-being. Home life and relationship well-being. Sexual activity and sexual health. Lifestyle, including: Alcohol, nicotine or tobacco, and drug use. Access to firearms. Diet, exercise, and sleep habits. Work and work Astronomer. Sunscreen use. Safety issues such as seatbelt and bike helmet use. Physical exam Your health care provider will check your: Height and weight. These may be used to calculate your BMI (body mass index). BMI is a measurement that tells if you are at a healthy weight. Waist circumference. This measures the distance around your waistline. This measurement also tells if you are at a healthy weight and may help predict your risk of certain diseases, such as type 2 diabetes and high blood pressure. Heart rate and blood pressure. Body temperature. Skin for abnormal spots. What immunizations do I need?  Vaccines are usually given at various ages, according to a schedule. Your health care provider will recommend vaccines for you based on your age, medical history, and lifestyle or other factors, such as travel or where you work. What tests do I need? Screening Your health care provider may recommend screening tests for certain conditions. This may include: Lipid and cholesterol levels. Diabetes screening. This is done by checking your blood sugar (glucose) after you have not eaten for a while  (fasting). Pelvic exam and Pap test. Hepatitis B test. Hepatitis C test. HIV (human immunodeficiency virus) test. STI (sexually transmitted infection) testing, if you are at risk. Lung cancer screening. Colorectal cancer screening. Mammogram. Talk with your health care provider about when you should start having regular mammograms. This may depend on whether you have a family history of breast cancer. BRCA-related cancer screening. This may be done if you have a family history of breast, ovarian, tubal, or peritoneal cancers. Bone density scan. This is done to screen for osteoporosis. Talk with your health care provider about your test results, treatment options, and if necessary, the need for more tests. Follow these instructions at home: Eating and drinking  Eat a diet that includes fresh fruits and vegetables, whole grains, lean protein, and low-fat dairy products. Take vitamin and mineral supplements as recommended by your health care provider. Do not drink alcohol if: Your health care provider tells you not to drink. You are pregnant, may be pregnant, or are planning to become pregnant. If you drink alcohol: Limit how much you have to 0-1 drink a day. Know how much alcohol is in your drink. In the [place] one drink equals one 12 oz bottle of beer (355 mL), one 5 oz glass of wine (148 mL), or one 1 oz glass of hard liquor (44 mL). Lifestyle Brush your teeth every morning and night with fluoride toothpaste. Floss one time each day. Exercise for at least 30 minutes 5 or more days each week. Do not use any products that contain nicotine or tobacco. These products include cigarettes, chewing tobacco, and vaping devices, such as e-cigarettes. If you need help quitting, ask your health care provider. Do not use drugs. If you are sexually active, practice safe sex. Use a  condom or other form of protection to prevent STIs. If you do not wish to become pregnant, use a form of birth control. If  you plan to become pregnant, see your health care provider for a prepregnancy visit. Take aspirin only as told by your health care provider. Make sure that you understand how much to take and what form to take. Work with your health care provider to find out whether it is safe and beneficial for you to take aspirin daily. Find healthy ways to manage stress, such as: Meditation, yoga, or listening to music. Journaling. Talking to a trusted person. Spending time with friends and family. Minimize exposure to UV radiation to reduce your risk of skin cancer. Safety Always wear your seat belt while driving or riding in a vehicle. Do not drive: If you have been drinking alcohol. Do not ride with someone who has been drinking. When you are tired or distracted. While texting. If you have been using any mind-altering substances or drugs. Wear a helmet and other protective equipment during sports activities. If you have firearms in your house, make sure you follow all gun safety procedures. Seek help if you have been physically or sexually abused. What's next? Visit your health care provider once a year for an annual wellness visit. Ask your health care provider how often you should have your eyes and teeth checked. Stay up to date on all vaccines. This information is not intended to replace advice given to you by your health care provider. Make sure you discuss any questions you have with your health care provider. Document Revised: 05/30/2021 Document Reviewed: 05/30/2021 Elsevier Patient Education  2024 [employer].

## 2023-12-01 NOTE — Progress Notes (Signed)
Complete physical exam  Patient: Victoria Pace   DOB: 06-27-1981   42 y.o. Female  MRN: [id number]  Subjective:    Chief Complaint  Patient presents with   Annual Exam    Victoria Pace is a 42 y.o. female who presents today for a complete physical exam. She reports consuming a  low sugar/low carb, increased protein  diet.  Trying to walk daily.   She generally feels well. She reports sleeping fairly well. She does have additional problems to discuss today.    Most recent fall risk assessment:    12/01/2023    8:27 AM  Fall Risk   Falls in the past year? 1  Number falls in past yr: 1  Injury with Fall? 0  Risk for fall due to : History of fall(s)     Most recent depression screenings:    04/17/2023    8:20 AM 10/15/2022    4:05 PM  PHQ 2/9 Scores  PHQ - 2 Score 0 0    Vision:Within last year and Dental: No current dental problems and Receives regular dental care    Patient Care Team: Christen Butter, NP as PCP - General (Nurse Practitioner)   Outpatient Medications Prior to Visit  Medication Sig   betamethasone dipropionate (DIPROLENE) 0.05 % ointment Apply topically 2 (two) times daily.   [DISCONTINUED] doxycycline (VIBRA-TABS) 100 MG tablet Take 1 tablet (100 mg total) by mouth 2 (two) times daily.   No facility-administered medications prior to visit.    Review of Systems  Constitutional:  Negative for chills, fever, malaise/fatigue and weight loss.  HENT:  Negative for congestion, ear pain, hearing loss, sinus pain and sore throat.   Eyes:  Negative for blurred vision, photophobia and pain.  Respiratory:  Negative for cough, shortness of breath and wheezing.   Cardiovascular:  Negative for chest pain, palpitations and leg swelling.  Gastrointestinal:  Negative for abdominal pain, constipation, diarrhea, heartburn, nausea and vomiting.  Genitourinary:  Negative for dysuria, frequency and urgency.  Musculoskeletal:  Negative for falls and neck pain.   Skin:  Negative for itching and rash.  Neurological:  Negative for dizziness, weakness and headaches.  Endo/Heme/Allergies:  Negative for polydipsia. Does not bruise/bleed easily.  Psychiatric/Behavioral:  Negative for depression, substance abuse and suicidal ideas. The patient has insomnia (intermittent). The patient is not nervous/anxious.      Objective:    BP 99/68 (BP Location: Left Arm, Cuff Size: Normal)   Pulse 67   Resp 20   Ht 5\' 8"  (1.727 m)   Wt 163 lb 6.4 oz (74.1 kg)   SpO2 100%   BMI 24.84 kg/m    Physical Exam Vitals reviewed.  Constitutional:      General: She is not in acute distress.    Appearance: Normal appearance. She is not ill-appearing.  HENT:     Head: Normocephalic and atraumatic.     Right Ear: Tympanic membrane, ear canal and external ear normal. There is no impacted cerumen.     Left Ear: Tympanic membrane, ear canal and external ear normal. There is no impacted cerumen.     Nose: Nose normal. No congestion or rhinorrhea.     Mouth/Throat:     Mouth: Mucous membranes are moist.     Pharynx: No oropharyngeal exudate or posterior oropharyngeal erythema.  Eyes:     General: No scleral icterus.       Right eye: No discharge.        Left  eye: No discharge.     Extraocular Movements: Extraocular movements intact.     Conjunctiva/sclera: Conjunctivae normal.     Pupils: Pupils are equal, round, and reactive to light.  Neck:     Thyroid: No thyromegaly.     Vascular: No carotid bruit or JVD.     Trachea: Trachea normal.  Cardiovascular:     Rate and Rhythm: Normal rate and regular rhythm.     Pulses: Normal pulses.     Heart sounds: Normal heart sounds. No murmur heard.    No friction rub. No gallop.  Pulmonary:     Effort: Pulmonary effort is normal. No respiratory distress.     Breath sounds: Normal breath sounds. No wheezing.  Abdominal:     General: Bowel sounds are normal. There is no distension.     Palpations: Abdomen is soft.      Tenderness: There is no abdominal tenderness. There is no guarding.  Musculoskeletal:        General: Normal range of motion.     Cervical back: Normal range of motion and neck supple.  Lymphadenopathy:     Cervical: No cervical adenopathy.  Skin:    General: Skin is warm and dry.     Findings: Lesion (healing insect bite to the right lower leg, improving) present.  Neurological:     Mental Status: She is alert and oriented to person, place, and time.     Cranial Nerves: No cranial nerve deficit.  Psychiatric:        Mood and Affect: Mood normal.        Behavior: Behavior normal.        Thought Content: Thought content normal.        Judgment: Judgment normal.   No results found for any visits on 12/01/23.     Assessment & Plan:    Routine Health Maintenance and Physical Exam  Immunization History  Administered Date(s) Administered   Influenza Inj Mdck Quad With Preservative 09/29/2018   Influenza, Seasonal, Injecte, Preservative Fre 12/01/2023   Influenza,inj,Quad PF,6+ Mos 11/18/2015, 12/23/2016, 08/07/2017, 10/15/2022   Influenza-Unspecified 10/16/2014   PFIZER(Purple Top)SARS-COV-2 Vaccination 02/11/2020, 03/06/2020   PPD Test 07/24/2017   Tdap 01/11/2014    Health Maintenance  Topic Date Due   Cervical Cancer Screening (HPV/Pap Cotest)  05/10/2022   COVID-19 Vaccine (3 - 2024-25 season) 12/06/2023 (Originally 08/17/2023)   DTaP/Tdap/Td (2 - Td or Tdap) 01/12/2024   INFLUENZA VACCINE  Completed   HPV VACCINES  Aged Out   Hepatitis C Screening  Discontinued   HIV Screening  Discontinued    Discussed health benefits of physical activity, and encouraged her to engage in regular exercise appropriate for her age and condition.  1. Annual physical exam (Primary) Checking labs as below. UTD on preventative care. Wellness information provided with AVS. - Lipid panel - CBC with Differential/Platelet - Comprehensive metabolic panel  2. Cervical cancer screening 3.  Encounter for screening mammogram for malignant neoplasm of breast Sees [facility]. UTD.   4. Abnormal thyroid function test Rechecking TSH. - TSH  5. Diabetes mellitus screening Checking A1c.  - Hemoglobin A1c  6. Need for influenza vaccination Flu vaccine given in office today.  - Flu vaccine trivalent PF, 6mos and older(Flulaval,Afluria,Fluarix,Fluzone)   Return in about 1 year (around 11/30/2024) for annual physical exam or sooner if needed.     Christen Butter, NP

## 2023-12-02 LAB — CBC WITH DIFFERENTIAL/PLATELET
Basophils Absolute: 0 10*3/uL (ref 0.0–0.2)
Basos: 0 %
EOS (ABSOLUTE): 0.1 10*3/uL (ref 0.0–0.4)
Eos: 1 %
Hematocrit: 42.4 % (ref 34.0–46.6)
Hemoglobin: 14.5 g/dL (ref 11.1–15.9)
Immature Grans (Abs): 0 10*3/uL (ref 0.0–0.1)
Immature Granulocytes: 0 %
Lymphocytes Absolute: 1.3 10*3/uL (ref 0.7–3.1)
Lymphs: 23 %
MCH: 30.7 pg (ref 26.6–33.0)
MCHC: 34.2 g/dL (ref 31.5–35.7)
MCV: 90 fL (ref 79–97)
Monocytes Absolute: 0.3 10*3/uL (ref 0.1–0.9)
Monocytes: 6 %
Neutrophils Absolute: 4 10*3/uL (ref 1.4–7.0)
Neutrophils: 70 %
Platelets: 267 10*3/uL (ref 150–450)
RBC: 4.72 x10E6/uL (ref 3.77–5.28)
RDW: 11.7 % (ref 11.7–15.4)
WBC: 5.7 10*3/uL (ref 3.4–10.8)

## 2023-12-02 LAB — LIPID PANEL
Chol/HDL Ratio: 3.8 {ratio} (ref 0.0–4.4)
Cholesterol, Total: 185 mg/dL (ref 100–199)
HDL: 49 mg/dL (ref >39)
LDL Chol Calc (NIH): 122 mg/dL — ABNORMAL HIGH (ref 0–99)
Triglycerides: 77 mg/dL (ref 0–149)
VLDL Cholesterol Cal: 14 mg/dL (ref 5–40)

## 2023-12-02 LAB — COMPREHENSIVE METABOLIC PANEL
ALT: 14 [IU]/L (ref 0–32)
AST: 17 [IU]/L (ref 0–40)
Albumin: 4.2 g/dL (ref 3.9–4.9)
Alkaline Phosphatase: 73 [IU]/L (ref 44–121)
BUN/Creatinine Ratio: 23 (ref 9–23)
BUN: 15 mg/dL (ref 6–24)
Bilirubin Total: 0.6 mg/dL (ref 0.0–1.2)
CO2: 22 mmol/L (ref 20–29)
Calcium: 8.9 mg/dL (ref 8.7–10.2)
Chloride: 103 mmol/L (ref 96–106)
Creatinine, Ser: 0.65 mg/dL (ref 0.57–1.00)
Globulin, Total: 2.8 g/dL (ref 1.5–4.5)
Glucose: 92 mg/dL (ref 70–99)
Potassium: 4.7 mmol/L (ref 3.5–5.2)
Sodium: 139 mmol/L (ref 134–144)
Total Protein: 7 g/dL (ref 6.0–8.5)
eGFR: 111 mL/min/{1.73_m2} (ref >59)

## 2023-12-02 LAB — HEMOGLOBIN A1C
Est. average glucose Bld gHb Est-mCnc: 105 mg/dL
Hgb A1c MFr Bld: 5.3 % (ref 4.8–5.6)

## 2023-12-02 LAB — TSH: TSH: 3.1 u[IU]/mL (ref 0.450–4.500)

## 2024-01-28 ENCOUNTER — Encounter: Payer: Self-pay | Admitting: Physician Assistant

## 2024-01-28 ENCOUNTER — Ambulatory Visit (INDEPENDENT_AMBULATORY_CARE_PROVIDER_SITE_OTHER): Payer: 59 | Admitting: Physician Assistant

## 2024-01-28 VITALS — BP 122/78 | HR 74 | Temp 99.4°F | Ht 68.0 in | Wt 162.0 lb

## 2024-01-28 DIAGNOSIS — R0602 Shortness of breath: Secondary | ICD-10-CM | POA: Diagnosis not present

## 2024-01-28 DIAGNOSIS — J101 Influenza due to other identified influenza virus with other respiratory manifestations: Secondary | ICD-10-CM | POA: Insufficient documentation

## 2024-01-28 DIAGNOSIS — R051 Acute cough: Secondary | ICD-10-CM | POA: Diagnosis not present

## 2024-01-28 DIAGNOSIS — R6889 Other general symptoms and signs: Secondary | ICD-10-CM

## 2024-01-28 LAB — POCT INFLUENZA A/B
Influenza A, POC: POSITIVE — AB
Influenza B, POC: NEGATIVE

## 2024-01-28 LAB — POCT RAPID STREP A (OFFICE): Rapid Strep A Screen: NEGATIVE

## 2024-01-28 LAB — POC COVID19 BINAXNOW: SARS Coronavirus 2 Ag: NEGATIVE

## 2024-01-28 MED ORDER — BENZONATATE 200 MG PO CAPS: 200.0000 mg | ORAL_CAPSULE | Freq: Three times a day (TID) | ORAL | 0 refills | Status: DC | PRN

## 2024-01-28 MED ORDER — ALBUTEROL SULFATE HFA 108 (90 BASE) MCG/ACT IN AERS: 2.0000 | INHALATION_SPRAY | Freq: Four times a day (QID) | RESPIRATORY_TRACT | 0 refills | Status: AC | PRN

## 2024-01-28 MED ORDER — IPRATROPIUM-ALBUTEROL 0.5-2.5 (3) MG/3ML IN SOLN: 3.0000 mL | Freq: Once | RESPIRATORY_TRACT | Status: AC

## 2024-01-28 MED ORDER — OSELTAMIVIR PHOSPHATE 75 MG PO CAPS: 75.0000 mg | ORAL_CAPSULE | Freq: Two times a day (BID) | ORAL | 0 refills | Status: DC

## 2024-01-28 NOTE — Patient Instructions (Signed)

## 2024-01-28 NOTE — Progress Notes (Signed)
Acute Office Visit  Subjective:     Patient ID: Victoria Pace, female    DOB: [birth year], 43 y.o.   MRN: [id number]   HPI Patient is a 43 yo female presenting with a chief complaint of a cough and fatigue.  She describes her cough as productive and deep. Associated sxs include: lethargy, fever, and headache. She states her symptoms have been going on since Monday. 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 She has been taking OTC cough and cold medications with little relief.   Review of Systems  Constitutional:  Positive for fever and malaise/fatigue.  HENT:  Positive for congestion and sore throat.   Eyes: Negative.   Respiratory:  Positive for cough. Negative for shortness of breath and wheezing.   Cardiovascular:  Negative for chest pain.  Gastrointestinal: Negative.   Genitourinary: Negative.   Musculoskeletal: Negative.   Skin: Negative.   Neurological:  Positive for headaches.      Objective:    BP 122/78   Pulse 74   Temp 99.4 F (37.4 C)   Ht 5\' 8"  (1.727 m)   Wt 162 lb (73.5 kg)   SpO2 99%   BMI 24.63 kg/m  BP Readings from Last 3 Encounters:  01/28/24 122/78  12/01/23 99/68  11/20/23 93/62   Wt Readings from Last 3 Encounters:  01/28/24 162 lb (73.5 kg)  12/01/23 163 lb 6.4 oz (74.1 kg)  11/20/23 163 lb 6.4 oz (74.1 kg)  .Marland Kitchen Results for orders placed or performed in visit on 01/28/24  POCT Influenza A/B   Collection Time: 01/28/24  1:45 PM  Result Value Ref Range   Influenza A, POC Positive (A) Negative   Influenza B, POC Negative Negative  POCT rapid strep A   Collection Time: 01/28/24  1:46 PM  Result Value Ref Range   Rapid Strep A Screen Negative Negative  POC COVID-19   Collection Time: 01/28/24  1:46 PM  Result Value Ref Range   SARS Coronavirus 2 Ag Negative Negative     Physical Exam Constitutional:      Appearance: She is  ill-appearing.  HENT:     Head: Normocephalic and atraumatic.     Right Ear: Tympanic membrane normal.     Left Ear: Tympanic membrane normal.     Nose: Congestion present.     Mouth/Throat:     Mouth: Mucous membranes are moist.  Eyes:     Extraocular Movements: Extraocular movements intact.  Cardiovascular:     Rate and Rhythm: Regular rhythm. Tachycardia present.  Pulmonary:     Effort: Pulmonary effort is normal.     Breath sounds: Normal breath sounds.     Comments: Decreased air movement Musculoskeletal:     Cervical back: Neck supple.  Neurological:     Mental Status: She is alert.  Psychiatric:        Mood and Affect: Mood normal.       Assessment & Plan:  .Marland KitchenVictorino Dike was seen today for cough.  Diagnoses and all orders for this visit:  Influenza A -     ipratropium-albuterol (DUONEB) 0.5-2.5 (3) MG/3ML nebulizer solution 3 mL -     benzonatate (TESSALON) 200 MG capsule; Take 1 capsule (200 mg total) by mouth 3 (three) times daily as needed. -     oseltamivir (TAMIFLU) 75 MG capsule; Take 1 capsule (75 mg  total) by mouth 2 (two) times daily. -     albuterol (VENTOLIN HFA) 108 (90 Base) MCG/ACT inhaler; Inhale 2 puffs into the lungs every 6 (six) hours as needed.  Flu-like symptoms -     POCT Influenza A/B -     POCT rapid strep A -     POC COVID-19  Acute cough -     ipratropium-albuterol (DUONEB) 0.5-2.5 (3) MG/3ML nebulizer solution 3 mL -     benzonatate (TESSALON) 200 MG capsule; Take 1 capsule (200 mg total) by mouth 3 (three) times daily as needed. -     albuterol (VENTOLIN HFA) 108 (90 Base) MCG/ACT inhaler; Inhale 2 puffs into the lungs every 6 (six) hours as needed.  Shortness of breath -     ipratropium-albuterol (DUONEB) 0.5-2.5 (3) MG/3ML nebulizer solution 3 mL -     albuterol (VENTOLIN HFA) 108 (90 Base) MCG/ACT inhaler; Inhale 2 puffs into the lungs every 6 (six) hours as needed.   Positive for Flu A Written out of work for the rest of the  week Vitals look reassuring Start tamiflu Duoneb given in office today Albuterol sent home to use 2-3 times a day Rest and hydrate, HO given Tessalon pearls as needed Follow up as needed or if symptoms worsen or persist  Tandy Gaw, PA-C

## 2024-01-29 ENCOUNTER — Encounter: Payer: Self-pay | Admitting: Physician Assistant

## 2024-01-30 MED ORDER — HYDROCOD POLI-CHLORPHE POLI ER 10-8 MG/5ML PO SUER: 5.0000 mL | Freq: Two times a day (BID) | ORAL | 0 refills | Status: DC | PRN

## 2024-01-30 NOTE — Telephone Encounter (Signed)
Copied from CRM [id number]. Topic: Clinical - Medical Advice >> Jan 30, 2024  8:47 AM Gildardo Pounds wrote: Reason for CRM: Patient had an appt on 01/28/2024 and diagnosed with the flu. Patient is now coughing so bad she is throwing up. Can you prescribe something for the cough? Callback number is [phone]

## 2024-02-24 ENCOUNTER — Other Ambulatory Visit: Payer: Self-pay | Admitting: Physician Assistant

## 2024-02-24 DIAGNOSIS — J101 Influenza due to other identified influenza virus with other respiratory manifestations: Secondary | ICD-10-CM

## 2024-02-24 DIAGNOSIS — R0602 Shortness of breath: Secondary | ICD-10-CM

## 2024-02-24 DIAGNOSIS — R051 Acute cough: Secondary | ICD-10-CM

## 2024-08-07 ENCOUNTER — Ambulatory Visit
Admission: RE | Admit: 2024-08-07 | Discharge: 2024-08-07 | Disposition: A | Discharge status: Home / Self Care | Source: Ambulatory Visit | Attending: Family Medicine | Admitting: Family Medicine

## 2024-08-07 ENCOUNTER — Other Ambulatory Visit: Payer: Self-pay

## 2024-08-07 VITALS — BP 97/66 | HR 75 | Temp 98.3°F | Resp 16

## 2024-08-07 DIAGNOSIS — R0981 Nasal congestion: Secondary | ICD-10-CM

## 2024-08-07 DIAGNOSIS — H938X1 Other specified disorders of right ear: Secondary | ICD-10-CM | POA: Diagnosis not present

## 2024-08-07 DIAGNOSIS — H6691 Otitis media, unspecified, right ear: Secondary | ICD-10-CM

## 2024-08-07 MED ORDER — AMOXICILLIN-POT CLAVULANATE 875-125 MG PO TABS: 1.0000 | ORAL_TABLET | Freq: Two times a day (BID) | ORAL | 0 refills | Status: AC

## 2024-08-07 NOTE — ED Triage Notes (Signed)
 Pt c/o nasal congestion/runny nose and RT ear fullness since Thurs. Denies fever. Sudafed prn.

## 2024-08-07 NOTE — ED Provider Notes (Addendum)
 [facility]    CSN: [id number] Arrival date & time: 08/07/24  1313      History   Chief Complaint Chief Complaint  Patient presents with   Ear Fullness    RT   Nasal Congestion    HPI Victoria Pace is a 43 y.o. female.   HPI very pleasant 43 year old female presents with nasal congestion, runny nose and right ear fullness since Thursday, 08/06/2023.  History reviewed. No pertinent past medical history.  Patient Active Problem List   Diagnosis Date Noted   Influenza A 01/28/2024   IUD (intrauterine device) in place 07/24/2017    Past Surgical History:  Procedure Laterality Date   CESAREAN SECTION  05/24/2011    OB History   No obstetric history on file.      Home Medications    Prior to Admission medications   Medication Sig Start Date End Date Taking? Authorizing Provider  amoxicillin -clavulanate (AUGMENTIN ) 875-125 MG tablet Take 1 tablet by mouth 2 (two) times daily for 10 days. 08/07/24 08/17/24 Yes Teddy Sharper, FNP  albuterol  (VENTOLIN  HFA) 108 (90 Base) MCG/ACT inhaler Inhale 2 puffs into the lungs every 6 (six) hours as needed. 01/28/24   Breeback, Jade L, PA-C  betamethasone  dipropionate (DIPROLENE ) 0.05 % ointment Apply topically 2 (two) times daily. 10/26/23   Maranda Jamee Jacob, MD    Family History Family History  Problem Relation Age of Onset   Prostate cancer Other    Diabetes Father    Cancer Father    Healthy Mother     Social History Social History   Tobacco Use   Smoking status: Never   Smokeless tobacco: Never  Substance Use Topics   Alcohol use: No    Alcohol/week: 0.0 standard drinks of alcohol   Drug use: No     Allergies   Patient has no known allergies.   Review of Systems Review of Systems  HENT:  Positive for congestion, ear pain, postnasal drip and rhinorrhea.   All other systems reviewed and are negative.    Physical Exam Triage Vital Signs ED Triage Vitals  Encounter Vitals Group     BP  08/07/24 1320 97/66     Girls Systolic BP Percentile --      Girls Diastolic BP Percentile --      Boys Systolic BP Percentile --      Boys Diastolic BP Percentile --      Pulse Rate 08/07/24 1320 75     Resp 08/07/24 1323 16     Temp 08/07/24 1320 98.3 F (36.8 C)     Temp Source 08/07/24 1320 Oral     SpO2 08/07/24 1320 96 %     Weight --      Height --      Head Circumference --      Peak Flow --      Pain Score 08/07/24 1322 0     Pain Loc --      Pain Education --      Exclude from Growth Chart --    No data found.  Updated Vital Signs BP 97/66 (BP Location: Right Arm)   Pulse 75   Temp 98.3 F (36.8 C) (Oral)   Resp 16   SpO2 96%   Visual Acuity Right Eye Distance:   Left Eye Distance:   Bilateral Distance:    Right Eye Near:   Left Eye Near:    Bilateral Near:     Physical Exam Vitals and  nursing note reviewed.  Constitutional:      Appearance: Normal appearance. She is obese.  HENT:     Head: Normocephalic and atraumatic.     Right Ear: Ear canal and external ear normal.     Left Ear: Tympanic membrane, ear canal and external ear normal.     Ears:     Comments: Right TM: Erythematous, bulging    Mouth/Throat:     Mouth: Mucous membranes are moist.     Pharynx: Oropharynx is clear.  Eyes:     Extraocular Movements: Extraocular movements intact.     Conjunctiva/sclera: Conjunctivae normal.     Pupils: Pupils are equal, round, and reactive to light.  Cardiovascular:     Rate and Rhythm: Normal rate and regular rhythm.     Pulses: Normal pulses.     Heart sounds: Normal heart sounds.  Pulmonary:     Effort: Pulmonary effort is normal.     Breath sounds: Normal breath sounds. No wheezing, rhonchi or rales.  Musculoskeletal:        General: Normal range of motion.  Skin:    General: Skin is warm and dry.  Neurological:     General: No focal deficit present.     Mental Status: She is alert and oriented to person, place, and time. Mental status is  at baseline.  Psychiatric:        Mood and Affect: Mood normal.        Behavior: Behavior normal.      UC Treatments / Results  Labs (all labs ordered are listed, but only abnormal results are displayed) Labs Reviewed - No data to display  EKG   Radiology No results found.  Procedures Procedures (including critical care time)  Medications Ordered in UC Medications - No data to display  Initial Impression / Assessment and Plan / UC Course  I have reviewed the triage vital signs and the nursing notes.  Pertinent labs & imaging results that were available during my care of the patient were reviewed by me and considered in my medical decision making (see chart for details).     MDM: 1.  Acute right otitis media-Rx'd Augmentin  875/125 mg tablet: Take 1 tablet twice daily x 10 days.  2.  Fullness in right ear-presumably d/t right ear infection. Advised patient to take medication as directed with food to completion.  Encouraged to increase daily water intake to 64 ounces per day while taking this medication.  Advised if symptoms worsen and/or unresolved please follow-up with your PCP, ENT, or here for further evaluation.  Patient discharged home, hemodynamically stable. Final Clinical Impressions(s) / UC Diagnoses   Final diagnoses:  Fullness in right ear  Acute right otitis media     Discharge Instructions      Advised patient to take medication as directed with food to completion.  Encouraged to increase daily water intake to 64 ounces per day while taking this medication.  Advised if symptoms worsen and/or unresolved please follow-up with your PCP, ENT, or here for further evaluation.     ED Prescriptions     Medication Sig Dispense Auth. Provider   amoxicillin -clavulanate (AUGMENTIN ) 875-125 MG tablet Take 1 tablet by mouth 2 (two) times daily for 10 days. 20 tablet Angeni Chaudhuri, FNP      PDMP not reviewed this encounter.   Teddy Sharper, FNP 08/07/24  1349    Teddy Sharper, FNP 08/07/24 1350

## 2024-08-07 NOTE — Discharge Instructions (Addendum)
 Advised patient to take medication as directed with food to completion.  Encouraged to increase daily water intake to 64 ounces per day while taking this medication.  Advised if symptoms worsen and/or unresolved please follow-up with your PCP, ENT, or here for further evaluation.

## 2024-11-22 LAB — HM MAMMOGRAPHY
